# Patient Record
Sex: Female | Born: 1985 | Race: White | Hispanic: No | Marital: Married | Smoking: Never smoker
Health system: Southern US, Community
[De-identification: ages and names within clinical notes are randomized; demographics above are authoritative.]

## PROBLEM LIST (undated history)

## (undated) ENCOUNTER — Inpatient Hospital Stay (HOSPITAL_COMMUNITY): Payer: Self-pay

## (undated) DIAGNOSIS — K219 Gastro-esophageal reflux disease without esophagitis: Secondary | ICD-10-CM

## (undated) DIAGNOSIS — J45909 Unspecified asthma, uncomplicated: Secondary | ICD-10-CM

## (undated) DIAGNOSIS — M069 Rheumatoid arthritis, unspecified: Secondary | ICD-10-CM

## (undated) HISTORY — PX: APPENDECTOMY: SHX54

## (undated) HISTORY — DX: Unspecified asthma, uncomplicated: J45.909

## (undated) HISTORY — DX: Gastro-esophageal reflux disease without esophagitis: K21.9

---

## 2009-05-19 ENCOUNTER — Ambulatory Visit (HOSPITAL_COMMUNITY): Admission: RE | Admit: 2009-05-19 | Discharge: 2009-05-19 | Payer: Self-pay | Admitting: Urology

## 2009-10-31 ENCOUNTER — Emergency Department (HOSPITAL_COMMUNITY): Admission: EM | Admit: 2009-10-31 | Discharge: 2009-10-31 | Payer: Self-pay | Admitting: Emergency Medicine

## 2010-12-25 LAB — POCT RAPID STREP A (OFFICE): Streptococcus, Group A Screen (Direct): NEGATIVE

## 2013-09-21 ENCOUNTER — Other Ambulatory Visit: Payer: Self-pay | Admitting: Family Medicine

## 2013-09-21 ENCOUNTER — Ambulatory Visit
Admission: RE | Admit: 2013-09-21 | Discharge: 2013-09-21 | Disposition: A | Discharge status: Home / Self Care | Payer: PRIVATE HEALTH INSURANCE | Source: Ambulatory Visit | Attending: Family Medicine | Admitting: Family Medicine

## 2013-09-21 DIAGNOSIS — R609 Edema, unspecified: Secondary | ICD-10-CM

## 2013-09-21 DIAGNOSIS — M2550 Pain in unspecified joint: Secondary | ICD-10-CM

## 2014-08-30 DIAGNOSIS — M059 Rheumatoid arthritis with rheumatoid factor, unspecified: Secondary | ICD-10-CM | POA: Diagnosis present

## 2014-08-30 DIAGNOSIS — Z8669 Personal history of other diseases of the nervous system and sense organs: Secondary | ICD-10-CM | POA: Insufficient documentation

## 2021-10-08 NOTE — L&D Delivery Note (Signed)
Delivery Note  Angela Atkinson is a [id number] at [redacted]w[redacted]d with an LMP of 02/26/22, consistent with bedside US at [redacted]w[redacted]d ordered today stat by CNM. Patient had no prenatal care with this pregnancy, or prior pregnancies. No obstetrical history available.   First Stage: Labor onset: 1638 Analgesia /Anesthesia intrapartum: none SROM at 1915 GBS: unknown IP Antibiotics: N/A pt refused  Second Stage: Complete dilation at 1915 Onset of pushing at 1915 FHR second stage category 2 bpm with moderate, and intermittent variable decels  Angela Atkinson presented to L&D with vaginal bleeding. She was 5/70/-2. She progressed  to C/C with a strong urge to push.  She pushed x1 for a spontaneous vaginal birth.  Delivery of a viable baby boy on 02/07/2022 at 1916 precipitously in bed, with FOB, Doula and friend at bedside. Baby placed on mom's chest by patient and attended to by baby RN. Yashira requested that the baby's cord remained attached to baby and placenta   Cord blood sample collection: No patient refused O POS Performed at [facility], [address] [place], [place] [address]  Collection of cord blood donation N/A Arterial cord blood sample N/A  Third Stage: Patient refused Oxytocin after delivery of infant for hemorrhage prophylaxis  Placenta delivered by CNM Duncan's intact @ 1930 Placenta disposition: home with patient Uterine tone firm / bleeding moderate Patient refused uterine exploration   laceration identified  Anesthesia for repair: N/A Repair none Est. Blood Loss (mL): 540  Complications: none  Mom desires to d/c home.  Baby to Couplet care / Skin to Skin.  Newborn: Information for the patient's newborn:  Areanna, Gengler [[id number]]  Live born female  Birth Weight:   APGAR: 7, 9  Newborn Delivery   Birth date/time: 02/07/2022 19:16:00 Delivery type: Vaginal, Spontaneous        Feeding planned: Breast  ---------- Chari Manning, CNM Certified Nurse  Midwife [place]  [facility]

## 2022-02-07 ENCOUNTER — Observation Stay: Payer: BC Managed Care – PPO

## 2022-02-07 ENCOUNTER — Inpatient Hospital Stay
Admission: EM | Admit: 2022-02-07 | Discharge: 2022-02-07 | DRG: 807 | Disposition: A | Discharge status: Home / Self Care | Payer: BC Managed Care – PPO | Attending: Obstetrics | Admitting: Obstetrics

## 2022-02-07 ENCOUNTER — Other Ambulatory Visit: Payer: Self-pay

## 2022-02-07 ENCOUNTER — Encounter: Payer: Self-pay | Admitting: Obstetrics and Gynecology

## 2022-02-07 DIAGNOSIS — O4693 Antepartum hemorrhage, unspecified, third trimester: Secondary | ICD-10-CM | POA: Diagnosis present

## 2022-02-07 DIAGNOSIS — Z3A37 37 weeks gestation of pregnancy: Secondary | ICD-10-CM

## 2022-02-07 DIAGNOSIS — O26893 Other specified pregnancy related conditions, third trimester: Principal | ICD-10-CM | POA: Diagnosis present

## 2022-02-07 HISTORY — DX: Rheumatoid arthritis, unspecified: M06.9

## 2022-02-07 LAB — CBC
HCT: 37.5 % (ref 36.0–46.0)
Hemoglobin: 12 g/dL (ref 12.0–15.0)
MCH: 26.4 pg (ref 26.0–34.0)
MCHC: 32 g/dL (ref 30.0–36.0)
MCV: 82.4 fL (ref 80.0–100.0)
Platelets: 275 10*3/uL (ref 150–400)
RBC: 4.55 MIL/uL (ref 3.87–5.11)
RDW: 12.2 % (ref 11.5–15.5)
WBC: 18.3 10*3/uL — ABNORMAL HIGH (ref 4.0–10.5)
nRBC: 0 % (ref 0.0–0.2)

## 2022-02-07 LAB — TYPE AND SCREEN
ABO/RH(D): O POS
Antibody Screen: NEGATIVE

## 2022-02-07 LAB — RAPID HIV SCREEN (HIV 1/2 AB+AG)
HIV 1/2 Antibodies: NONREACTIVE
HIV-1 P24 Antigen - HIV24: NONREACTIVE

## 2022-02-07 LAB — ABO/RH: ABO/RH(D): O POS

## 2022-02-07 LAB — HEPATITIS B SURFACE ANTIGEN: Hepatitis B Surface Ag: NONREACTIVE

## 2022-02-07 MED ORDER — ACETAMINOPHEN 325 MG PO TABS: 650.0000 mg | ORAL_TABLET | ORAL | Status: DC | PRN

## 2022-02-07 MED ORDER — CALCIUM CARBONATE ANTACID 500 MG PO CHEW: 2.0000 | CHEWABLE_TABLET | ORAL | Status: DC | PRN

## 2022-02-07 MED ORDER — OXYTOCIN BOLUS FROM INFUSION: 333.0000 mL | Freq: Once | INTRAVENOUS | Status: DC

## 2022-02-07 MED ORDER — BUTORPHANOL TARTRATE 1 MG/ML IJ SOLN: 1.0000 mg | INTRAMUSCULAR | Status: DC | PRN

## 2022-02-07 MED ORDER — MISOPROSTOL 200 MCG PO TABS
ORAL_TABLET | ORAL | Status: AC
  Filled 2022-02-07: qty 4

## 2022-02-07 MED ORDER — OXYTOCIN 10 UNIT/ML IJ SOLN
INTRAMUSCULAR | Status: AC
  Filled 2022-02-07: qty 2

## 2022-02-07 MED ORDER — OXYTOCIN-SODIUM CHLORIDE 30-0.9 UT/500ML-% IV SOLN
INTRAVENOUS | Status: AC
  Filled 2022-02-07: qty 500

## 2022-02-07 MED ORDER — OXYTOCIN-SODIUM CHLORIDE 30-0.9 UT/500ML-% IV SOLN: 2.5000 [IU]/h | INTRAVENOUS | Status: DC

## 2022-02-07 MED ORDER — LIDOCAINE HCL (PF) 1 % IJ SOLN
INTRAMUSCULAR | Status: AC
  Filled 2022-02-07: qty 30

## 2022-02-07 MED ORDER — IBUPROFEN 600 MG PO TABS: 600.0000 mg | ORAL_TABLET | Freq: Four times a day (QID) | ORAL | 0 refills | Status: DC | PRN

## 2022-02-07 MED ORDER — DOCUSATE SODIUM 100 MG PO CAPS: 100.0000 mg | ORAL_CAPSULE | Freq: Every day | ORAL | 0 refills | Status: DC

## 2022-02-07 MED ORDER — AMMONIA AROMATIC IN INHA
RESPIRATORY_TRACT | Status: AC
  Filled 2022-02-07: qty 10

## 2022-02-07 MED ORDER — ZOLPIDEM TARTRATE 5 MG PO TABS: 5.0000 mg | ORAL_TABLET | Freq: Every evening | ORAL | Status: DC | PRN

## 2022-02-07 MED ORDER — PRENATAL MULTIVITAMIN CH: 1.0000 | ORAL_TABLET | Freq: Every day | ORAL | Status: DC

## 2022-02-07 MED ORDER — CALCIUM CARBONATE ANTACID 500 MG PO CHEW: 2.0000 | CHEWABLE_TABLET | ORAL | Status: AC | PRN

## 2022-02-07 MED ORDER — SOD CITRATE-CITRIC ACID 500-334 MG/5ML PO SOLN: 30.0000 mL | ORAL | Status: DC | PRN

## 2022-02-07 MED ORDER — LACTATED RINGERS IV SOLN: 500.0000 mL | INTRAVENOUS | Status: DC | PRN

## 2022-02-07 MED ORDER — ONDANSETRON HCL 4 MG/2ML IJ SOLN: 4.0000 mg | Freq: Four times a day (QID) | INTRAMUSCULAR | Status: DC | PRN

## 2022-02-07 MED ORDER — LIDOCAINE HCL (PF) 1 % IJ SOLN: 30.0000 mL | INTRAMUSCULAR | Status: DC | PRN

## 2022-02-07 MED ORDER — DOCUSATE SODIUM 100 MG PO CAPS: 100.0000 mg | ORAL_CAPSULE | Freq: Every day | ORAL | Status: DC

## 2022-02-07 MED ORDER — LACTATED RINGERS IV SOLN: INTRAVENOUS | Status: DC

## 2022-02-07 NOTE — H&P (Signed)
OB History & Physical   History of Present Illness:   Chief Complaint:37.[redacted] weeks gestation with vaginal bleeding and contractions  HPI:  Shereece Wellborn is a 36 y.o. [phone] female at [redacted]w[redacted]d dated by LMP per patient.  She presents to L&D for vaginal bleeding and active labor  Reports active fetal movement  Contractions: every 1 to 2 minutes LOF/SROM: intact Vaginal bleeding: moderate amount of  dark red blood  Factors complicating pregnancy:  No prenatal care  There are no problems to display for this patient.    Maternal Medical History:   Past Medical History:  Diagnosis Date   Rheumatoid arthritis (HCC)     History reviewed. No pertinent surgical history.  No Known Allergies  Prior to Admission medications   Medication Sig Start Date End Date Taking? Authorizing Provider  etanercept (ENBREL) 50 MG/ML injection Inject 50 mg into the skin once a week.   Yes [provider]  Prenatal Vit-Fe Fumarate-FA (MULTIVITAMIN-PRENATAL) 27-0.8 MG TABS tablet Take 1 tablet by mouth daily at 12 noon.   Yes [provider]     Prenatal care site:  none  Social History: She  reports that she has never smoked. She has never used smokeless tobacco.  Family History: family history is not on file.   Review of Systems: A full review of systems was performed and negative except as noted in the HPI.     Physical Exam:  Vital Signs: BP 133/87 (BP Location: Left Arm)   Pulse (!) 102   Temp 98.3 F (36.8 C) (Oral)   Resp 18   Ht 5' 9.5" (1.765 m)   Wt 68.5 kg   Breastfeeding Unknown   BMI 21.98 kg/m  Physical Exam  General: no acute distress.  HEENT: normocephalic, atraumatic Heart: regular rate & rhythm.  No murmurs/rubs/gallops Lungs: clear to auscultation bilaterally, normal respiratory effort Abdomen: soft, gravid, non-tender;  EFW: 3,048g Pelvic:   External: Normal external female genitalia  Cervix: Dilation: 5 / Effacement (%): 70 /       Extremities: non-tender, symmetric, no edema bilaterally.  DTRs: +2  Neurologic: Alert & oriented x 3.    No results found for this or any previous visit (from the past 24 hour(s)).  Pertinent Results:  Prenatal Labs: Blood type/Rh O pos  Antibody screen neg  Rubella pending  Varicella pending  RPR pending  HBsAg pending  HIV NR  GC refused  Chlamydia refused  Genetic screening Not done  1 hour GTT none  3 hour GTT none  GBS refused   FHT:  FHR: 145 bpm, variability: moderate,  accelerations:  Present,  decelerations:  Absent Category/reactivity:  Category I UC:   regular, every 1-2 minutes   Cephalic by [place] and SVE   No results found.  Assessment:  Jaelin Devincentis is a 36 y.o. [phone] female at [redacted]w[redacted]d with vaginal bleeding and uterine contractions in third trimester. Monchel reports that this is her fourth baby and she has not had any prenatal care with any of therm and has had all uncomplicated births at home. She reports she is dated by her LMP and dating was verified by a 20 week [place].  She states the only reason she came to the hospital is because she noticed vaginal bleeding around 1330 today [place] with her [place]. She then states she then took a shower to see if it would help her feel better. Once the bleeding continued she decided to come in  to the hospital for evaluation.   Plan:  1. Admit to Labor & Delivery - consents reviewed and obtained  2. Fetal Well being  - Fetal Tracing: category 1 - Group B Streptococcus ppx unknown: GBS unknown - Presentation: vertex confirmed by [place]   3. Patient refused GBS, and IV  4. Monitoring of labor  - Contractions monitored with external toco - Pelvis proven to 8.13 lbs adequate for trial of labor  - Plan for expectant management  -  expectant management - Plan for  continuous fetal monitoring - Maternal pain control as desired; planning unmedicated labor support options  - Anticipate vaginal  delivery  5. Post Partum Planning:Patient plans to leave a couple hours after delivery. - Infant feeding: breast - Contraception: TBD - Tdap vaccine: declined - Flu vaccine: declined  Discussed plan of care with Dr Feliberto Gottron,   Chari Manning CNM Certified Nurse Midwife [place]  [facility]

## 2022-02-07 NOTE — Progress Notes (Signed)
Patient declined recommendations of standard care and testing as well as any interventions such as an IV, uterotonic's, and lab work such as GBS, Scientist, physiological. Patient has had no prenatal care for the entirety of the pregnancy.  Stat bedside US verified gestational age, normal fluid, and presentation. [place] verified that no placental abruption or previa was present.  Patient states that she wants to delivery her own baby, wants baby born and delayed cord clamping until the delivery of the placenta. I expressed concerns about her vaginal bleeding and wanting to administer Pitocin to prevent a PP hemorrhage. Patient continued to declined. Patient then counseled on risks of refusing labs, IV, and medications including, infection, hemorrhage, and loss of life for both her and baby.   Patient reports desire of  leaving a couple hours after delivery. Neonatology, and SS aware. Dr Ouida Sills notified and aware of discussion and patient wishes.  Avelino Leeds CNM

## 2022-02-07 NOTE — Progress Notes (Signed)
Pt to be d/c home and to self care. Pt given teaching on when to seek medical attention (LOF, VB and decreased FM, etc..). Pt verbalized understanding of d/c instructions.   ?

## 2022-02-07 NOTE — Discharge Summary (Signed)
Obstetrical Discharge Summary  Patient Name: Angela Atkinson DOB: 1985/10/18 MRN: [id number]  Date of Admission: 02/07/2022 Date of Delivery: 02/07/22 Delivered by: Angela Atkinson Date of Discharge: 02/07/2022  Primary OB: No PNC  LMP:No LMP recorded. Patient is pregnant. EDC Estimated Date of Delivery: 02/26/22 Gestational Age at Delivery: [redacted]w[redacted]d   Antepartum complications:  No PNC, planned homebirth  Admitting Diagnosis: 37wks, vaginal bleeding Secondary Diagnosis: SVD  Patient Active Problem List   Diagnosis Date Noted   Normal labor and delivery 02/07/2022    Augmentation: N/A Complications: None Intrapartum complications/course: Pt delivered with minimal assistance, has chosen to have Lotus birth. Stable PP bleeding and intact perineum.  Date of Delivery: 02/07/22 Delivered By: Angela Atkinson CNM Delivery Type: spontaneous vaginal delivery Anesthesia: none Placenta: spontaneous Laceration: none Episiotomy: none Newborn Data: Live born female "Angela Atkinson" Birth Weight:  pending- pt opted for infant to be weighed at home.  APGAR: 7, 9  Newborn Delivery   Birth date/time: 02/07/2022 19:16:00 Delivery type: Vaginal, Spontaneous      Postpartum Procedures: none  Edinburgh: Pt declined EPDS screening.       View : No data to display.            Post partum course:  Patient had an uncomplicated postpartum course.  By time of discharge on PPD#0 at approx 3hours postpartum, her pain was controlled on oral pain medications; she had appropriate lochia and was ambulating, voiding without difficulty and tolerating regular diet.  She was deemed stable for discharge to home.     Discharge Physical Exam: 02/07/22 at 2120  BP 133/87 (BP Location: Left Arm)   Pulse (!) 102   Temp 98.3 F (36.8 C) (Oral)   Resp 18   Ht 5' 9.5" (1.765 m)   Wt 68.5 kg   Breastfeeding Unknown   BMI 21.98 kg/m   General: NAD CV: RRR Pulm: CTABL, nl effort ABD: s/nd/nt, fundus firm and below the  umbilicus Lochia: scant Perineum: intact DVT Evaluation: LE non-ttp, no evidence of DVT on exam.  Hemoglobin  Date Value Ref Range Status  02/07/2022 12.0 12.0 - 15.0 g/dL Final   HCT  Date Value Ref Range Status  02/07/2022 37.5 36.0 - 46.0 % Final     Disposition: stable, discharge to home. Baby Feeding: breastmilk Baby Disposition: home with mom  Rh Immune globulin given: n/a Rubella vaccine given: pending Varicella vaccine given: pending Tdap vaccine given in AP or PP setting: declined PP Flu vaccine given in AP or PP setting: declined PP  Contraception: Lactational amenorrhea  Prenatal Labs:  Blood type/Rh O pos  Antibody screen neg  Rubella pending  Varicella pending  RPR pending  HBsAg pending  HIV NR  GC refused  Chlamydia refused  Genetic screening Not done  1 hour GTT none  3 hour GTT none  GBS refused      Plan:  Angela Atkinson was discharged to home in good condition. Follow-up appointment with delivering provider in 6 weeks.  Discharge Medications: Allergies as of 02/07/2022   No Known Allergies      Medication List     TAKE these medications    acetaminophen 325 MG tablet Commonly known as: TYLENOL Take 2 tablets (650 mg total) by mouth every 4 (four) hours as needed (for pain scale < 4  OR  temperature  >/=  100.5 F).   calcium carbonate 500 MG chewable tablet Commonly known as: TUMS - dosed in mg elemental calcium Chew 2 tablets (400 mg of  elemental calcium total) by mouth every 4 (four) hours as needed for indigestion.   docusate sodium 100 MG capsule Commonly known as: COLACE Take 1 capsule (100 mg total) by mouth daily.   Enbrel 50 MG/ML injection Generic drug: etanercept Inject 50 mg into the skin once a week.   ibuprofen 600 MG tablet Commonly known as: ADVIL Take 1 tablet (600 mg total) by mouth every 6 (six) hours as needed.   multivitamin-prenatal 27-0.8 MG Tabs tablet Take 1 tablet by mouth daily at 12 noon.           Signed:  Randa Atkinson, CNM 02/07/2022  9:35 PM

## 2022-02-07 NOTE — OB Triage Note (Signed)
Pt presents from home with ctx every 1-2 minutes, vaginal bleeding and decreased FM. Pt reports no prenatal care but had [place] at 20w. Hx of 3 home births. Bleeding started around 1pm. VSS.

## 2022-02-08 LAB — RUBELLA SCREEN: Rubella: 1.62 {index}

## 2022-02-08 LAB — VARICELLA ZOSTER ANTIBODY, IGG: Varicella IgG: 897 {index}

## 2022-02-08 NOTE — TOC Initial Note (Signed)
Transition of Care [facility]) - Initial/Assessment Note    Patient Details  Name: Angela Atkinson MRN: [id number] Date of Birth: [birth year]  Transition of [facility]) CM/SW Contact:    Orchard Cellar, RN Phone Number: 02/08/2022, 12:53 PM  Clinical Narrative:                 Confirmed with [place] CPS case was screened out as no needs identified. 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 Has 3 other children and a supportive husband at home. Prefers more natural lifestyle with limited medical interventions.         Patient Goals and CMS Choice        Expected Discharge Plan and Services           Expected Discharge Date: 02/07/22                                    Prior Living Arrangements/Services                       Activities of Daily Living Home Assistive Devices/Equipment: None ADL Screening (condition at time of admission) Patient's cognitive ability adequate to safely complete daily activities?: No Is the patient deaf or have difficulty hearing?: No Does the patient have difficulty seeing, even when wearing glasses/contacts?: No Does the patient have difficulty concentrating, remembering, or making decisions?: No Patient able to express need for assistance with ADLs?: Yes Does the patient have difficulty dressing or bathing?: No Independently performs ADLs?: Yes (appropriate for developmental age) Does the patient have difficulty walking or climbing stairs?: No Weakness of Legs: None Weakness of Arms/Hands: None  Permission Sought/Granted                  Emotional Assessment              Admission diagnosis:  Vaginal bleeding during pregnancy, antepartum [O46.90] Normal labor and delivery [O80] Patient Active Problem List   Diagnosis Date Noted   Normal labor and delivery 02/07/2022   PCP:  No primary care provider on file. Pharmacy:   [id number] [place], [place] - [address] [place] [address] Phone: [phone] Fax: [phone]     Social Determinants of Health (SDOH) Interventions    Readmission Risk Interventions     View : No data to display.

## 2022-02-10 LAB — RPR: RPR Ser Ql: NONREACTIVE

## 2022-02-28 ENCOUNTER — Emergency Department: Payer: BC Managed Care – PPO

## 2022-02-28 ENCOUNTER — Observation Stay
Admission: EM | Admit: 2022-02-28 | Discharge: 2022-03-01 | Disposition: A | Discharge status: Home / Self Care | Payer: BC Managed Care – PPO | Attending: Surgery | Admitting: Surgery

## 2022-02-28 ENCOUNTER — Encounter: Payer: Self-pay | Admitting: Emergency Medicine

## 2022-02-28 ENCOUNTER — Observation Stay: Payer: BC Managed Care – PPO | Admitting: Anesthesiology

## 2022-02-28 ENCOUNTER — Other Ambulatory Visit: Payer: Self-pay

## 2022-02-28 ENCOUNTER — Encounter
Admission: EM | Disposition: A | Discharge status: Home / Self Care | Payer: Self-pay | Source: Home / Self Care | Attending: Emergency Medicine

## 2022-02-28 DIAGNOSIS — K3580 Unspecified acute appendicitis: Principal | ICD-10-CM | POA: Diagnosis present

## 2022-02-28 DIAGNOSIS — R1030 Lower abdominal pain, unspecified: Secondary | ICD-10-CM

## 2022-02-28 DIAGNOSIS — D72829 Elevated white blood cell count, unspecified: Secondary | ICD-10-CM | POA: Diagnosis not present

## 2022-02-28 DIAGNOSIS — K353 Acute appendicitis with localized peritonitis, without perforation or gangrene: Secondary | ICD-10-CM

## 2022-02-28 HISTORY — PX: LAPAROSCOPIC APPENDECTOMY: SHX408

## 2022-02-28 LAB — COMPREHENSIVE METABOLIC PANEL
ALT: 24 U/L (ref 0–44)
AST: 23 U/L (ref 15–41)
Albumin: 3.9 g/dL (ref 3.5–5.0)
Alkaline Phosphatase: 73 U/L (ref 38–126)
Anion gap: 10 (ref 5–15)
BUN: 19 mg/dL (ref 6–20)
CO2: 23 mmol/L (ref 22–32)
Calcium: 9.6 mg/dL (ref 8.9–10.3)
Chloride: 105 mmol/L (ref 98–111)
Creatinine, Ser: 0.75 mg/dL (ref 0.44–1.00)
GFR, Estimated: >60 mL/min (ref >60)
Glucose, Bld: 118 mg/dL — ABNORMAL HIGH (ref 70–99)
Potassium: 3.9 mmol/L (ref 3.5–5.1)
Sodium: 138 mmol/L (ref 135–145)
Total Bilirubin: 1 mg/dL (ref 0.3–1.2)
Total Protein: 7.7 g/dL (ref 6.5–8.1)

## 2022-02-28 LAB — URINALYSIS, ROUTINE W REFLEX MICROSCOPIC
Bacteria, UA: NONE SEEN
Bilirubin Urine: NEGATIVE
Glucose, UA: NEGATIVE mg/dL
Ketones, ur: 20 mg/dL — AB
Nitrite: NEGATIVE
Protein, ur: NEGATIVE mg/dL
RBC / HPF: >50 RBC/hpf — ABNORMAL HIGH (ref 0–5)
Specific Gravity, Urine: 1.021 (ref 1.005–1.030)
pH: 5 (ref 5.0–8.0)

## 2022-02-28 LAB — CBC
HCT: 37.3 % (ref 36.0–46.0)
Hemoglobin: 11.5 g/dL — ABNORMAL LOW (ref 12.0–15.0)
MCH: 24.4 pg — ABNORMAL LOW (ref 26.0–34.0)
MCHC: 30.8 g/dL (ref 30.0–36.0)
MCV: 79 fL — ABNORMAL LOW (ref 80.0–100.0)
Platelets: 352 10*3/uL (ref 150–400)
RBC: 4.72 MIL/uL (ref 3.87–5.11)
RDW: 12.9 % (ref 11.5–15.5)
WBC: 12.5 10*3/uL — ABNORMAL HIGH (ref 4.0–10.5)
nRBC: 0 % (ref 0.0–0.2)

## 2022-02-28 LAB — POC URINE PREG, ED: Preg Test, Ur: NEGATIVE

## 2022-02-28 LAB — LIPASE, BLOOD: Lipase: 44 U/L (ref 11–51)

## 2022-02-28 SURGERY — APPENDECTOMY, LAPAROSCOPIC
Anesthesia: General | Site: Abdomen

## 2022-02-28 MED ORDER — FENTANYL CITRATE (PF) 100 MCG/2ML IJ SOLN
INTRAMUSCULAR | Status: AC
Start: 2022-02-28 — End: ?
  Filled 2022-02-28: qty 2

## 2022-02-28 MED ORDER — ROCURONIUM BROMIDE 100 MG/10ML IV SOLN
INTRAVENOUS | Status: DC | PRN
  Administered 2022-02-28: 50 mg via INTRAVENOUS

## 2022-02-28 MED ORDER — DROPERIDOL 2.5 MG/ML IJ SOLN: 0.6250 mg | Freq: Once | INTRAMUSCULAR | Status: DC | PRN

## 2022-02-28 MED ORDER — 0.9 % SODIUM CHLORIDE (POUR BTL) OPTIME
TOPICAL | Status: DC | PRN
  Administered 2022-02-28: 5 mL

## 2022-02-28 MED ORDER — KETOROLAC TROMETHAMINE 30 MG/ML IJ SOLN
INTRAMUSCULAR | Status: DC | PRN
  Administered 2022-02-28: 30 mg via INTRAVENOUS

## 2022-02-28 MED ORDER — ONDANSETRON 4 MG PO TBDP: 4.0000 mg | ORAL_TABLET | Freq: Four times a day (QID) | ORAL | Status: DC | PRN

## 2022-02-28 MED ORDER — LIDOCAINE HCL (CARDIAC) PF 100 MG/5ML IV SOSY
PREFILLED_SYRINGE | INTRAVENOUS | Status: DC | PRN
  Administered 2022-02-28: 100 mg via INTRAVENOUS

## 2022-02-28 MED ORDER — BUPIVACAINE-EPINEPHRINE (PF) 0.25% -1:200000 IJ SOLN
INTRAMUSCULAR | Status: DC | PRN
  Administered 2022-02-28: 42 mL via INTRAMUSCULAR

## 2022-02-28 MED ORDER — LACTATED RINGERS IV SOLN: INTRAVENOUS | Status: DC | PRN

## 2022-02-28 MED ORDER — OXYCODONE HCL 5 MG/5ML PO SOLN: 5.0000 mg | Freq: Once | ORAL | Status: DC | PRN

## 2022-02-28 MED ORDER — FENTANYL CITRATE (PF) 100 MCG/2ML IJ SOLN: 25.0000 ug | INTRAMUSCULAR | Status: DC | PRN

## 2022-02-28 MED ORDER — ACETAMINOPHEN 10 MG/ML IV SOLN
INTRAVENOUS | Status: DC | PRN
  Administered 2022-02-28: 1000 mg via INTRAVENOUS

## 2022-02-28 MED ORDER — ACETAMINOPHEN 10 MG/ML IV SOLN
INTRAVENOUS | Status: AC
  Filled 2022-02-28: qty 100

## 2022-02-28 MED ORDER — ACETAMINOPHEN 325 MG PO TABS
650.0000 mg | ORAL_TABLET | Freq: Four times a day (QID) | ORAL | Status: DC | PRN
  Administered 2022-03-01: 650 mg via ORAL
  Filled 2022-02-28: qty 2

## 2022-02-28 MED ORDER — SUGAMMADEX SODIUM 200 MG/2ML IV SOLN
INTRAVENOUS | Status: DC | PRN
  Administered 2022-02-28: 250 mg via INTRAVENOUS

## 2022-02-28 MED ORDER — OXYCODONE HCL 5 MG PO TABS: 5.0000 mg | ORAL_TABLET | Freq: Once | ORAL | Status: DC | PRN

## 2022-02-28 MED ORDER — PIPERACILLIN-TAZOBACTAM 3.375 G IVPB 30 MIN
3.3750 g | Freq: Once | INTRAVENOUS | Status: AC
  Administered 2022-02-28: 3.375 g via INTRAVENOUS
  Filled 2022-02-28: qty 50

## 2022-02-28 MED ORDER — PROMETHAZINE HCL 25 MG/ML IJ SOLN: 6.2500 mg | INTRAMUSCULAR | Status: DC | PRN

## 2022-02-28 MED ORDER — MIDAZOLAM HCL 2 MG/2ML IJ SOLN
INTRAMUSCULAR | Status: AC
Start: 2022-02-28 — End: ?
  Filled 2022-02-28: qty 2

## 2022-02-28 MED ORDER — ACETAMINOPHEN 650 MG RE SUPP: 650.0000 mg | Freq: Four times a day (QID) | RECTAL | Status: DC | PRN

## 2022-02-28 MED ORDER — PIPERACILLIN-TAZOBACTAM 3.375 G IVPB
3.3750 g | Freq: Three times a day (TID) | INTRAVENOUS | Status: DC
  Administered 2022-02-28 – 2022-03-01 (×2): 3.375 g via INTRAVENOUS
  Filled 2022-02-28 (×3): qty 50

## 2022-02-28 MED ORDER — BUPIVACAINE LIPOSOME 1.3 % IJ SUSP
INTRAMUSCULAR | Status: AC
  Filled 2022-02-28: qty 20

## 2022-02-28 MED ORDER — ONDANSETRON HCL 4 MG/2ML IJ SOLN
INTRAMUSCULAR | Status: DC | PRN
  Administered 2022-02-28: 4 mg via INTRAVENOUS

## 2022-02-28 MED ORDER — IBUPROFEN 600 MG PO TABS
600.0000 mg | ORAL_TABLET | Freq: Four times a day (QID) | ORAL | Status: DC | PRN
  Administered 2022-03-01 (×2): 600 mg via ORAL
  Filled 2022-02-28 (×2): qty 1

## 2022-02-28 MED ORDER — ACETAMINOPHEN 10 MG/ML IV SOLN: 1000.0000 mg | Freq: Once | INTRAVENOUS | Status: DC | PRN

## 2022-02-28 MED ORDER — IOHEXOL 300 MG/ML  SOLN
100.0000 mL | Freq: Once | INTRAMUSCULAR | Status: AC | PRN
  Administered 2022-02-28: 100 mL via INTRAVENOUS

## 2022-02-28 MED ORDER — BUPIVACAINE-EPINEPHRINE (PF) 0.25% -1:200000 IJ SOLN
INTRAMUSCULAR | Status: AC
  Filled 2022-02-28: qty 30

## 2022-02-28 MED ORDER — PROPOFOL 10 MG/ML IV BOLUS
INTRAVENOUS | Status: DC | PRN
  Administered 2022-02-28: 160 mg via INTRAVENOUS

## 2022-02-28 MED ORDER — FENTANYL CITRATE (PF) 100 MCG/2ML IJ SOLN
INTRAMUSCULAR | Status: DC | PRN
Start: 2022-02-28 — End: 2022-02-28
  Administered 2022-02-28 (×2): 50 ug via INTRAVENOUS

## 2022-02-28 MED ORDER — ONDANSETRON HCL 4 MG/2ML IJ SOLN: 4.0000 mg | Freq: Four times a day (QID) | INTRAMUSCULAR | Status: DC | PRN

## 2022-02-28 MED ORDER — SODIUM CHLORIDE 0.9 % IR SOLN
Status: DC | PRN
  Administered 2022-02-28: 1500 mL

## 2022-02-28 MED ORDER — SODIUM CHLORIDE 0.9 % IV SOLN: INTRAVENOUS | Status: DC

## 2022-02-28 SURGICAL SUPPLY — 44 items
BAG RETRIEVAL 10 (BASKET) ×1
BLADE CLIPPER SURG (BLADE) ×2 IMPLANT
CUTTER FLEX LINEAR 45M (STAPLE) ×2 IMPLANT
DERMABOND ADVANCED (GAUZE/BANDAGES/DRESSINGS) ×1
DERMABOND ADVANCED .7 DNX12 (GAUZE/BANDAGES/DRESSINGS) ×1 IMPLANT
ELECT CAUTERY BLADE 6.4 (BLADE) ×1 IMPLANT
ELECT REM PT RETURN 9FT ADLT (ELECTROSURGICAL) ×2
ELECTRODE REM PT RTRN 9FT ADLT (ELECTROSURGICAL) ×1 IMPLANT
GLOVE BIO SURGEON STRL SZ7 (GLOVE) ×2 IMPLANT
GLOVE INDICATOR 7.0 STRL GRN (GLOVE) ×2 IMPLANT
GLOVE ORTHO TXT STRL SZ7.5 (GLOVE) ×3 IMPLANT
GOWN STRL REUS W/ TWL LRG LVL3 (GOWN DISPOSABLE) ×1 IMPLANT
GOWN STRL REUS W/ TWL XL LVL3 (GOWN DISPOSABLE) ×1 IMPLANT
GOWN STRL REUS W/TWL LRG LVL3 (GOWN DISPOSABLE) ×2
GOWN STRL REUS W/TWL XL LVL3 (GOWN DISPOSABLE) ×4
GRASPER SUT TROCAR 14GX15 (MISCELLANEOUS) IMPLANT
IRRIGATION STRYKERFLOW (MISCELLANEOUS) IMPLANT
IRRIGATOR STRYKERFLOW (MISCELLANEOUS) ×2
IV NS IRRIG 3000ML ARTHROMATIC (IV SOLUTION) ×1 IMPLANT
KIT TURNOVER KIT A (KITS) ×2 IMPLANT
MANIFOLD NEPTUNE II (INSTRUMENTS) ×2 IMPLANT
NDL INSUFFLATION 14GA 120MM (NEEDLE) IMPLANT
NEEDLE HYPO 22GX1.5 SAFETY (NEEDLE) ×2 IMPLANT
NEEDLE INSUFFLATION 14GA 120MM (NEEDLE) IMPLANT
NS IRRIG 500ML POUR BTL (IV SOLUTION) ×2 IMPLANT
PACK LAP CHOLECYSTECTOMY (MISCELLANEOUS) ×2 IMPLANT
PENCIL ELECTRO HAND CTR (MISCELLANEOUS) ×1 IMPLANT
RELOAD 45 VASCULAR/THIN (ENDOMECHANICALS) ×2 IMPLANT
RELOAD STAPLE 45 2.5 WHT GRN (ENDOMECHANICALS) ×1 IMPLANT
SET TUBE SMOKE EVAC HIGH FLOW (TUBING) ×2 IMPLANT
SHEARS HARMONIC ACE PLUS 36CM (ENDOMECHANICALS) ×2 IMPLANT
SLEEVE ADV FIXATION 5X100MM (TROCAR) ×2 IMPLANT
SPIKE FLUID TRANSFER (MISCELLANEOUS) ×1 IMPLANT
SUT MNCRL 4-0 (SUTURE) ×2
SUT MNCRL 4-0 27XMFL (SUTURE) ×1
SUT VICRYL 0 AB UR-6 (SUTURE) IMPLANT
SUTURE MNCRL 4-0 27XMF (SUTURE) ×1 IMPLANT
SYS BAG RETRIEVAL 10MM (BASKET) ×1
SYS KII FIOS ACCESS ABD 5X100 (TROCAR) ×2
SYSTEM BAG RETRIEVAL 10MM (BASKET) ×1 IMPLANT
SYSTEM KII FIOS ACES ABD 5X100 (TROCAR) ×1 IMPLANT
TRAY FOLEY MTR SLVR 16FR STAT (SET/KITS/TRAYS/PACK) ×1 IMPLANT
TROCAR ADV FIXATION 12X100MM (TROCAR) ×2 IMPLANT
WATER STERILE IRR 500ML POUR (IV SOLUTION) ×2 IMPLANT

## 2022-02-28 NOTE — Op Note (Signed)
Laparascopic appendectomy   Angela Atkinson Date of operation:  02/28/2022  Indications: The patient presented with a history of  abdominal pain. Workup has revealed findings consistent with acute appendicitis.  Pre-operative Diagnosis: Acute appendicitis without mention of peritonitis  Post-operative Diagnosis: Same  Surgeon: Campbell Lerner, M.D., FACS  Anesthesia: General with endotracheal tube  Findings: Exudative appendicitis with adjacent adhesive process to epiploicae and retroperitoneum.  Cloudy yellow fluid in pelvis.  Estimated Blood Loss: Minimal         Specimens: appendix         Complications: None  Procedure Details  The patient was seen again in the preop area. The options of surgery versus observation were reviewed with the patient and/or family. The risks of bleeding, infection, recurrence of symptoms, negative laparoscopy, potential for an open procedure, bowel injury, abscess or infection, were all reviewed as well. The patient was taken to Operating Room, identified as Angela Atkinson and the procedure verified as laparoscopic appendectomy. A Time Out was held and the above information confirmed. The patient was placed in the supine position and general anesthesia was induced.  Antibiotic prophylaxis was administered pre-op and VTE prophylaxis was in place.   The abdomen was prepped and draped in a sterile fashion. Local infiltration with 0.25% Marcaine with epi mixed with Exparel is administered to all incisions.  An peri-umbilical incision was made.  A towel grasper is applied for countertraction, and a 5 mm optical trocar was passed into the peritoneal cavity under direct visualization.  Pneumoperitoneum obtained. One 12 mm port in the LLQ, and another 5 mm port were placed under direct visualization.  The appendix was identified.  The appendix was carefully dissected. The mesoappendix was divided with Harmonic scalpel. The base of the appendix was dissected out and  divided with a 45 mm white load Endo GIA.The appendix was placed in a Endo Catch bag and removed via the 12 mm LLQ port. The right lower quadrant and pelvis was then irrigated with  normal saline which was aspirated. Inspection  failed to identify any additional bleeding and there were no signs of bowel injury. Again the right lower quadrant was inspected there was no sign of bleeding or bowel injury. The LLQ fascia was closed with 0 Vicryl using the suture passer, pneumoperitoneum was released, all ports were removed, and the skin incisions were approximated with subcuticular 4-0 Monocryl. Dermabond was applied.  The patient tolerated the procedure well, there were no complications. The sponge lap and needle count were correct at the end of the procedure.  The patient was taken to the recovery room in stable condition to be admitted for overnight observation.  Campbell Lerner, M.D., [facility] 02/28/2022 - 7:54 PM

## 2022-02-28 NOTE — ED Triage Notes (Signed)
Pt presents via POV with complaints of lower abdominal pain for the last 12 hours with associated nausea. No meds taken PTA. Denies Emesis, CP, or urinary sx.

## 2022-02-28 NOTE — ED Provider Notes (Signed)
Patient received in signout from Dr. York Cerise pending follow-up ultrasound.  Ultrasound was equivocal but patient with right lower quadrant pain on exam therefore CT abdomen pelvis with IV contrast was ordered after patient was given time to make informed consent.  CT imaging does show evidence of acute appendicitis.  She does not have any allergies to any antibiotics.  Discussed the case in consultation with Dr. Claudine Mouton of general surgery.   Willy Eddy, MD 02/28/22 [phone]

## 2022-02-28 NOTE — Anesthesia Procedure Notes (Signed)
Procedure Name: Intubation Date/Time: 02/28/2022 6:59 PM Performed by: Nelda Marseille, CRNA Pre-anesthesia Checklist: Patient identified, Patient being monitored, Timeout performed, Emergency Drugs available and Suction available Patient Re-evaluated:Patient Re-evaluated prior to induction Oxygen Delivery Method: Circle system utilized Preoxygenation: Pre-oxygenation with 100% oxygen Induction Type: IV induction Ventilation: Mask ventilation without difficulty Laryngoscope Size: Mac, 3 and McGraph Grade View: Grade I Tube type: Oral Tube size: 7.0 mm Number of attempts: 1 Airway Equipment and Method: Stylet Placement Confirmation: ETT inserted through vocal cords under direct vision, positive ETCO2 and breath sounds checked- equal and bilateral Secured at: 21 cm Tube secured with: Tape Dental Injury: Teeth and Oropharynx as per pre-operative assessment

## 2022-02-28 NOTE — ED Provider Notes (Signed)
[facility] Provider Note    Event Date/Time   First MD Initiated Contact with Patient 02/28/22 0354     (approximate)   History   Abdominal Pain   HPI  Angela Atkinson is a 36 y.o. female G4, P4 who had a normal spontaneous vaginal delivery at [facility] about 3 weeks ago.  She presents for evaluation of acute onset pain about 15 hours ago in her lower middle abdomen that has gradually gotten worse and has become severe.  It is accompanied with nausea but no vomiting and no diarrhea.  She reports that she has done no prenatal care and home births with all 4 of her children, but for this last 1 she started having heavy bleeding so she needed to come to the emergency department because she is an ED nurse in [employer].  There was some concern she may have a placental abruption but she had an ultrasound prior to the delivery which was reassuring.  She ended up having a normal spontaneous vaginal delivery with no complications.  She had been doing fine and not having any significant vaginal bleeding, just a little bit of spotting when she wipes, until the onset of the severe pain yesterday afternoon.  She also notes that she had an elevated temperature of just over 100 degrees at home.  No respiratory symptoms.  No dysuria.     Physical Exam   Triage Vital Signs: ED Triage Vitals  Enc Vitals Group     BP 02/28/22 0349 132/78     Pulse Rate 02/28/22 0349 (!) 103     Resp 02/28/22 0349 18     Temp 02/28/22 0349 98.3 F (36.8 C)     Temp Source 02/28/22 0349 Oral     SpO2 02/28/22 0349 98 %     Weight 02/28/22 0348 61.2 kg (135 lb)     Height 02/28/22 0348 1.765 m (5' 9.5")     Head Circumference --      Peak Flow --      Pain Score 02/28/22 0348 9     Pain Loc --      Pain Edu? --      Excl. in GC? --     Most recent vital signs: Vitals:   02/28/22 0349 02/28/22 0507  BP: 132/78 110/80  Pulse: (!) 103 94  Resp: 18 16  Temp: 98.3 F (36.8 C)   SpO2:  98% 98%     General: Awake, appears uncomfortable. CV:  Good peripheral perfusion.  Resp:  Normal effort.  Speaking easily and comfortably, no accessory muscle usage. Abd:  Healthy body habitus, not obese.  Localized peritonitis in the suprapubic region.  There is some mild tenderness in other areas but she has rebound and guarding infraumbilical and suprapubic.   ED Results / Procedures / Treatments   Labs (all labs ordered are listed, but only abnormal results are displayed) Labs Reviewed  COMPREHENSIVE METABOLIC PANEL - Abnormal; Notable for the following components:      Result Value   Glucose, Bld 118 (*)    All other components within normal limits  CBC - Abnormal; Notable for the following components:   WBC 12.5 (*)    Hemoglobin 11.5 (*)    MCV 79.0 (*)    MCH 24.4 (*)    All other components within normal limits  URINALYSIS, ROUTINE W REFLEX MICROSCOPIC - Abnormal; Notable for the following components:   Color, Urine YELLOW (*)    APPearance  HAZY (*)    Hgb urine dipstick LARGE (*)    Ketones, ur 20 (*)    Leukocytes,Ua SMALL (*)    RBC / HPF >50 (*)    All other components within normal limits  LIPASE, BLOOD  POC URINE PREG, ED    RADIOLOGY Ultrasound pending at time of transfer of care    PROCEDURES:  Critical Care performed: No  Procedures   MEDICATIONS ORDERED IN ED: Medications - No data to display   IMPRESSION / MDM / ASSESSMENT AND PLAN / ED COURSE  I reviewed the triage vital signs and the nursing notes.                              Differential diagnosis includes, but is not limited to, retained products of conception, endometritis, hemoperitoneum, uterine rupture, ovarian cysts, renal/ureteral colic, UTI/pyelonephritis, STD/PID.  Patient's presentation is most consistent with acute presentation with potential threat to life or bodily function.  Patient's vital signs are notable for mild tachycardia at 103.  She appears acutely  uncomfortable.  However she reports that she is breast-feeding and she wants to have as " minimally invasive" and evaluation and treatment as she can.  She does not want to take any medications unless absolutely necessary and also wants to avoid a CT scan with IV contrast if there is any concern that the IV contrast would be a problem for her breast-feeding infant.  I reviewed her discharge summary from [facility] on 02/07/2022.  It appears that the patient declined essentially all treatments, medications, vaccinations, even weight measurement at the time of the delivery.  I personally reviewed and interpreted the pelvic ultrasound performed prior to the delivery of the baby and see evidence of a normal intrauterine pregnancy but did not see any abnormalities at that time.  However she had no follow-up imaging.  I talked with her about the various imaging modalities, explaining that I am very concerned about the possibility of retained products of conception and/or developing endometritis.  She does not want to proceed with any treatment until additional evaluation.  We are going to obtain a pelvic ultrasound as per ultrasound protocol to look for evidence of retained products and/or endometritis and pelvic free fluid.  The patient understands we may need to proceed with a CT scan with IV contrast.  Labs ordered include CMP, CBC, urinalysis, lipase, and urine pregnancy test.  The CMP, lipase, and CBC are all essentially normal except that she does have a mild leukocytosis of 12.5.  Urine pregnancy test is negative.  Urinalysis demonstrates hematuria but no clear evidence of infection.   Clinical Course as of 02/28/22 0714  Wed Feb 28, 2022  0709 Transferred ED care to Dr. Roxan Hockey to follow up on ultrasound and treat/dispo appropriately. [CF]    Clinical Course User Index [CF] Loleta Rose, MD     FINAL CLINICAL IMPRESSION(S) / ED DIAGNOSES   Final diagnoses:  Lower abdominal pain     Rx  / DC Orders   ED Discharge Orders     None        Note:  This document was prepared using Dragon voice recognition software and may include unintentional dictation errors.   Loleta Rose, MD 02/28/22 [phone]

## 2022-02-28 NOTE — Progress Notes (Signed)
Admission profile updated. ?

## 2022-02-28 NOTE — ED Notes (Signed)
Z. S., PA at bedside now.

## 2022-02-28 NOTE — ED Notes (Signed)
Lupita Leash, [place] technician at bedside to take patient to ultrasound department. Patient stated her mother and baby would need to come with her as she is breastfeeding and states her baby could wake up and need to eat. Patient informed that visitors are not permitted in [place] department. Patient states that she is not going without family. Lupita Leash reports she will bring machine to room.

## 2022-02-28 NOTE — Lactation Note (Signed)
Lactation Consultation Note  Patient Name: Angela Atkinson [id number] Date: 02/28/2022   Age:36 y.o.  Maternal Data   Patient is 3 weeks post partum.This is her 4th baby and she is exclusively breastfeeding. She is an experienced breastfeeding mother with a history of mastitis with her first baby, and experienced clogged ducts when breastfeeding and using a [place] pump that created over supply at subsequent breastfeeding experiences.   Today she is experiencing a small area on her right breast that she feels is beginning to clog. Per her report she has had some difficulty positioning baby on the right side due to her IV. She has questions today about what to expect with her milk supply related to her impending surgery. Per chart review patient came to ED with acute onset of abdominal pain. She is currently waiting to go to the OR for acute appendicitis. She reports she is hoping not to take narcotic pain medication for pain management if possible.  Feeding   Patient is exclusively breastfeeding. Baby is with her. The patient's husband will bring bottles from home to feed the baby when she is in surgery. Mom's plan is to continue breastfeeding as soon as she is able.     Interventions  Discussed management of clogged duct, strategies to proactively decrease potential decrease in milk supply that can happen when the body perceives a stressor, and provided information regarding compatibility of breastfeeding with management of her post op pain. Also, provided patient with information of receiving anesthesia while lactating. Patient verbalized understanding of information provided.  Consult Status  PRN  Update provided to care nurse.    [name] 02/28/2022, 2:11 PM

## 2022-02-28 NOTE — Discharge Instructions (Addendum)
From the [employer] of Anesthesia regarding breast feeding and anesthesia medications:  The following recommendations are suggested for lactating women requiring surgery:  1. All anesthetic and analgesic drugs transfer to breastmilk; however, only small amounts are present and in very low concentrations considered clinically insignificant.  2. Narcotics and/or their metabolites may transfer in slightly higher levels into breastmilk; therefore, steps should be taken to lower narcotic requirements by adding other analgesics when appropriate and avoiding drugs that are more likely to transfer (i.e., have a higher RID).  3. Because pain interferes with successful breastfeeding, women should not avoid pain medicines after surgery. Despite an excellent safety record, breastfeeding women who require narcotic pain medicines should always watch the baby closely for signs of sedation: difficult to wake and/or slowed breathing.  4. When possible, spinal or epidural anesthesia consisting of local anesthetic and a long-acting narcotic, should be used for cesarean delivery to reduce overall post-operative pain medication requirements.   5. Patients should resume breastfeeding as soon as possible after surgery because anesthetic drugs appear in such low levels in breastmilk. It is not recommended that patients "pump and dump."   In addition to included general post-operative instructions,  Diet: Resume home diet.   Activity: No heavy lifting >20 pounds (children, pets, laundry, garbage) for 4 weeks, but light activity and walking are encouraged. Do not drive or drink alcohol if taking narcotic pain medications or having pain that might distract from driving.  Wound care: 2 days after surgery (05/26), you may shower/get incision wet with soapy water and pat dry (do not rub incisions), but no baths or submerging incision underwater until follow-up.   Medications: Resume all home medications. For mild to  moderate pain: acetaminophen (Tylenol) or ibuprofen/naproxen (if no kidney disease). Combining Tylenol with alcohol can substantially increase your risk of causing liver disease. Narcotic pain medications, if prescribed, can be used for severe pain, though may cause nausea, constipation, and drowsiness. Do not combine Tylenol and Percocet (or similar) within a 6 hour period as Percocet (and similar) contain(s) Tylenol. If you do not need the narcotic pain medication, you do not need to fill the prescription.  Call office [phone] / [phone]) at any time if any questions, worsening pain, fevers/chills, bleeding, drainage from incision site, or other concerns.

## 2022-02-28 NOTE — Anesthesia Preprocedure Evaluation (Addendum)
Anesthesia Evaluation  Patient identified by MRN, date of birth, ID band Patient awake    Reviewed: Allergy & Precautions, NPO status , Patient's Chart, lab work & pertinent test results  Airway Mallampati: III  TM Distance: >3 FB Neck ROM: full    Dental no notable dental hx.    Pulmonary neg pulmonary ROS,    Pulmonary exam normal        Cardiovascular negative cardio ROS Normal cardiovascular exam     Neuro/Psych negative neurological ROS  negative psych ROS   GI/Hepatic Neg liver ROS, acute appendicitis   Endo/Other  negative endocrine ROS  Renal/GU      Musculoskeletal  (+) Arthritis , Rheumatoid disorders,    Abdominal   Peds  Hematology  (+) Blood dyscrasia, anemia ,   Anesthesia Other Findings 3 weeks post-partum   Past Medical History: No date: Rheumatoid arthritis (HCC)  BMI    Body Mass Index: 19.65 kg/m      Reproductive/Obstetrics negative OB ROS                            Anesthesia Physical Anesthesia Plan  ASA: 2  Anesthesia Plan: General ETT   Post-op Pain Management: Tylenol PO (pre-op)* and Toradol IV (intra-op)*   Induction: Intravenous  PONV Risk Score and Plan: Ondansetron, Dexamethasone, Midazolam and Treatment may vary due to age or medical condition  Airway Management Planned: Oral ETT  Additional Equipment:   Intra-op Plan:   Post-operative Plan: Extubation in OR  Informed Consent: I have reviewed the patients History and Physical, chart, labs and discussed the procedure including the risks, benefits and alternatives for the proposed anesthesia with the patient or authorized representative who has indicated his/her understanding and acceptance.     Dental Advisory Given  Plan Discussed with: Anesthesiologist, CRNA and Surgeon  Anesthesia Plan Comments:        Anesthesia Quick Evaluation

## 2022-02-28 NOTE — ED Notes (Signed)
Warm blankets provided to patient. Recliner brought to room for patient's mother. Denies additional needs at this time.

## 2022-02-28 NOTE — Transfer of Care (Signed)
Immediate Anesthesia Transfer of Care Note  Patient: Angela Atkinson  Procedure(s) Performed: APPENDECTOMY LAPAROSCOPIC (Abdomen)  Patient Location: PACU  Anesthesia Type:General  Level of Consciousness: oriented, drowsy and patient cooperative  Airway & Oxygen Therapy: Patient Spontanous Breathing  Post-op Assessment: Report given to RN and Post -op Vital signs reviewed and stable  Post vital signs: Reviewed and stable  Last Vitals:  Vitals Value Taken Time  BP    Temp    Pulse    Resp    SpO2      Last Pain:  Vitals:   02/28/22 1603  TempSrc: Oral  PainSc:          Complications: No notable events documented.

## 2022-02-28 NOTE — H&P (Signed)
[facility] SURGICAL HISTORY & PHYSICAL (cpt [phone])  HISTORY OF PRESENT ILLNESS (HPI):  36 y.o. female presented to [facility] ED this morning for abdominal pain. Patient reports the acute onset of diffuse lower abdominal pain yesterday afternoon. This has continued to progressively worsen since the onset. Worse in the suprapubic region. Pain has been accompanied by decreased appetite, nausea, and fever to 100.33F at home. No CP, SOB, urinary changes, or bowel changes. Of note, she is 3 weeks post-partum (G4 P4), notes reviewed. She is breast feeding. Work up in the ED did reveal leukocytosis to 12.5K, normal renal function with sCr - 0.75, and no electrolyte derangements. She did undergo CT Abdomen/Pelvis which was concerning for acute appendicitis.   General surgery is consulted by emergency medicine physician Dr Willy Eddy, MD for evaluation and management of acute appendicitis    PAST MEDICAL HISTORY (PMH):  Past Medical History:  Diagnosis Date   Rheumatoid arthritis (HCC)     Reviewed. Otherwise negative.   PAST SURGICAL HISTORY (PSH):  History reviewed. No pertinent surgical history.  Reviewed. Otherwise negative.   MEDICATIONS:  Prior to Admission medications   Medication Sig Start Date End Date Taking? Authorizing Provider  acetaminophen (TYLENOL) 325 MG tablet Take 2 tablets (650 mg total) by mouth every 4 (four) hours as needed (for pain scale < 4  OR  temperature  >/=  100.5 F). 02/07/22   McVey, Prudencio Pair, CNM  calcium carbonate (TUMS - DOSED IN MG ELEMENTAL CALCIUM) 500 MG chewable tablet Chew 2 tablets (400 mg of elemental calcium total) by mouth every 4 (four) hours as needed for indigestion. 02/07/22   McVey, Prudencio Pair, CNM  docusate sodium (COLACE) 100 MG capsule Take 1 capsule (100 mg total) by mouth daily. 02/07/22   McVey, Prudencio Pair, CNM  etanercept (ENBREL) 50 MG/ML injection Inject 50 mg into the skin once a week.    [provider]  ibuprofen  (ADVIL) 600 MG tablet Take 1 tablet (600 mg total) by mouth every 6 (six) hours as needed. 02/07/22   McVey, Prudencio Pair, CNM  Prenatal Vit-Fe Fumarate-FA (MULTIVITAMIN-PRENATAL) 27-0.8 MG TABS tablet Take 1 tablet by mouth daily at 12 noon.    [provider]     ALLERGIES:  No Known Allergies   SOCIAL HISTORY:  Social History   Socioeconomic History   Marital status: Unknown    Spouse name: Not on file   Number of children: Not on file   Years of education: Not on file   Highest education level: Not on file  Occupational History   Not on file  Tobacco Use   Smoking status: Never   Smokeless tobacco: Never  Substance and Sexual Activity   Alcohol use: Not on file   Drug use: Not on file   Sexual activity: Not on file  Other Topics Concern   Not on file  Social History Narrative   Not on file   Social Determinants of Health   Financial Resource Strain: Not on file  Food Insecurity: Not on file  Transportation Needs: Not on file  Physical Activity: Not on file  Stress: Not on file  Social Connections: Not on file  Intimate Partner Violence: Not on file     FAMILY HISTORY:  History reviewed. No pertinent family history.  Otherwise negative.   REVIEW OF SYSTEMS:  Review of Systems  Constitutional:  Positive for fever. Negative for chills.       + Decreased Appetite  HENT:  Negative for congestion and sore throat.   Respiratory:  Negative for cough and shortness of breath.   Cardiovascular:  Negative for chest pain and palpitations.  Gastrointestinal:  Positive for abdominal pain and nausea. Negative for constipation, diarrhea and vomiting.  Genitourinary:  Negative for dysuria and urgency.  All other systems reviewed and are negative.  VITAL SIGNS:  Temp:  [98.3 F (36.8 C)] 98.3 F (36.8 C) (05/24 0349) Pulse Rate:  [94-103] 94 (05/24 0507) Resp:  [16-18] 16 (05/24 0507) BP: (110-132)/(78-80) 110/80 (05/24 0507) SpO2:  [98 %] 98 % (05/24  0507) Weight:  [61.2 kg] 61.2 kg (05/24 0348)     Height: 5' 9.5" (176.5 cm) Weight: 61.2 kg BMI (Calculated): 19.66   PHYSICAL EXAM:  Physical Exam Vitals and nursing note reviewed. Exam conducted with a chaperone present.  Constitutional:      General: She is not in acute distress.    Appearance: She is well-developed. She is not ill-appearing.     Comments: Patient in NAD, breastfeeding infant   HENT:     Head: Normocephalic and atraumatic.  Eyes:     General: No scleral icterus.    Extraocular Movements: Extraocular movements intact.  Cardiovascular:     Rate and Rhythm: Regular rhythm. Tachycardia present.     Heart sounds: Normal heart sounds.  Pulmonary:     Effort: Pulmonary effort is normal. No respiratory distress.  Abdominal:     General: Abdomen is flat. There is no distension.     Palpations: Abdomen is soft.     Tenderness: There is abdominal tenderness in the right lower quadrant and suprapubic area. Positive signs include Rovsing's sign. Negative signs include McBurney's sign.     Comments: Abdomen is soft, she is more tender in the suprapubic region than RLQ consistent with anatomic location of her appendix on imaging, non-distended, no rebound/guarding   Genitourinary:    Comments: Deferred Skin:    General: Skin is warm and dry.     Coloration: Skin is not jaundiced.     Findings: No erythema.  Neurological:     General: No focal deficit present.     Mental Status: She is alert and oriented to person, place, and time.  Psychiatric:        Mood and Affect: Mood normal.        Behavior: Behavior normal.    INTAKE/OUTPUT:  This shift: No intake/output data recorded.  Last 2 shifts: @IOLAST2SHIFTS @  Labs:     Latest Ref Rng & Units 02/28/2022    3:47 AM 02/07/2022    3:57 PM  CBC  WBC 4.0 - 10.5 K/uL 12.5   18.3    Hemoglobin 12.0 - 15.0 g/dL 16.111.5   09.612.0    Hematocrit 36.0 - 46.0 % 37.3   37.5    Platelets 150 - 400 K/uL 352   275        Latest Ref  Rng & Units 02/28/2022    3:47 AM  CMP  Glucose 70 - 99 mg/dL [id number]    BUN 6 - 20 mg/dL 19    Creatinine 4.090.44 - 1.00 mg/dL 8.110.75    Sodium 914135 - 782145 mmol/L 138    Potassium 3.5 - 5.1 mmol/L 3.9    Chloride 98 - 111 mmol/L 105    CO2 22 - 32 mmol/L 23    Calcium 8.9 - 10.3 mg/dL 9.6    Total Protein 6.5 - 8.1 g/dL 7.7    Total  Bilirubin 0.3 - 1.2 mg/dL 1.0    Alkaline Phos 38 - 126 U/L 73    AST 15 - 41 U/L 23    ALT 0 - 44 U/L 24       Imaging studies:   CT Abdomen/Pelvis (02/28/2022) personally reviewed which shows dilated and inflamed appendix without abscess nor perforation, and radiologist report reviewed below:  IMPRESSION: Findings consistent with acute appendicitis. No definite abscess formation is noted.   17 mm low density with nodular enhancement is noted posteriorly in right hepatic lobe most consistent with hemangioma, although other pathology cannot be excluded When the patient is clinically stable and able to follow directions and hold their breath (preferably as an outpatient) further evaluation with dedicated abdominal MRI should be considered.   Assessment/Plan: (ICD-10's: K35.30) 36 y.o. female with abdominal pain, fever, leukocytosis foind to have acute uncomplicated appendicitis   - Will admit to general surgery   - Plan for laparoscopic appendectomy today with Dr Claudine Mouton pending OR/Anesthesia availability. I did discuss potential for conservative measures with Abx alone; however, given her pain, leukocytosis, and fever, I do think the more prudent decision is to proceed with surgery, she agrees.   - All risks, benefits, and alternatives to above procedure(s) were discussed with the patient, all of her questions were answered to her expressed satisfaction, patient expresses she wishes to proceed, and informed consent was obtained.    - NPO + IVF resuscitation  - IV Abx (Zosyn)  - Monitor abdominal examination  - I did offer pain medication/antiemetics but  patient declined  - Morning labs; CBC   - Hold DVT prophylaxis   All of the above findings and recommendations were discussed with the patient, and all of her questions were answered to her expressed satisfaction.  -- Lynden Oxford, PA-C [facility] 02/28/2022, 9:12 AM M-F: 7am - 4pm

## 2022-03-01 ENCOUNTER — Encounter: Payer: Self-pay | Admitting: Surgery

## 2022-03-01 DIAGNOSIS — K3580 Unspecified acute appendicitis: Secondary | ICD-10-CM | POA: Diagnosis not present

## 2022-03-01 LAB — CBC
HCT: 32 % — ABNORMAL LOW (ref 36.0–46.0)
Hemoglobin: 9.7 g/dL — ABNORMAL LOW (ref 12.0–15.0)
MCH: 24.1 pg — ABNORMAL LOW (ref 26.0–34.0)
MCHC: 30.3 g/dL (ref 30.0–36.0)
MCV: 79.6 fL — ABNORMAL LOW (ref 80.0–100.0)
Platelets: 286 10*3/uL (ref 150–400)
RBC: 4.02 MIL/uL (ref 3.87–5.11)
RDW: 13.2 % (ref 11.5–15.5)
WBC: 8.9 10*3/uL (ref 4.0–10.5)
nRBC: 0 % (ref 0.0–0.2)

## 2022-03-01 LAB — BASIC METABOLIC PANEL
Anion gap: 4 — ABNORMAL LOW (ref 5–15)
BUN: 15 mg/dL (ref 6–20)
CO2: 25 mmol/L (ref 22–32)
Calcium: 7.7 mg/dL — ABNORMAL LOW (ref 8.9–10.3)
Chloride: 106 mmol/L (ref 98–111)
Creatinine, Ser: 0.66 mg/dL (ref 0.44–1.00)
GFR, Estimated: >60 mL/min (ref >60)
Glucose, Bld: 104 mg/dL — ABNORMAL HIGH (ref 70–99)
Potassium: 3.8 mmol/L (ref 3.5–5.1)
Sodium: 135 mmol/L (ref 135–145)

## 2022-03-01 NOTE — Progress Notes (Signed)
Patient discharged home with family.  Discharge instructions, when to follow up, and medications reviewed with patient.  Patient verbalized understanding. Patient will be escorted out by auxiliary.   

## 2022-03-01 NOTE — Discharge Summary (Signed)
Physician Discharge Summary  Patient ID: Angela Atkinson MRN: [id number] DOB/AGE: 03/27/1986 36 y.o.  Admit date: 02/28/2022 Discharge date: 03/01/2022  Admission Diagnoses: Acute appendicitis  Discharge Diagnoses:  Principal Problem:   Acute appendicitis   Discharged Condition: good  Hospital Course: ED work-up led to acute appendicitis, laparoscopic appendectomy completed.  Overnight observation with excellent pain control, tolerance of diet.  Consults: None  Significant Diagnostic Studies: Radiology  Treatments: Laparoscopic appendectomy  Discharge Exam: Blood pressure (!) 91/54, pulse 82, temperature 98.8 F (37.1 C), temperature source Oral, resp. rate (!) 24, height 5' 9.5" (1.765 m), weight 61.2 kg, SpO2 98 %, currently breastfeeding. Incisions clean dry and intact.  Abdomen nondistended.  Patient appears well.  Disposition: Discharge disposition: 01-Home or Self Care       Discharge Instructions     Call MD for:  persistant nausea and vomiting   Complete by: As directed    Call MD for:  redness, tenderness, or signs of infection (pain, swelling, redness, odor or green/yellow discharge around incision site)   Complete by: As directed    Call MD for:  severe uncontrolled pain   Complete by: As directed    Diet - low sodium heart healthy   Complete by: As directed    Discharge wound care:   Complete by: As directed    Your incision was closed with Dermabond.  It is best to keep it clean and dry, it will tolerate a brief shower, but do not soak it or apply any creams or lotions to the incisions.  The Dermabond should gradually flake off over time.  Keep it open to air so you can evaluate your incisions.  Dermabond assists the underlying sutures to keep your incision closed and protected from infection.  Should you develop some drainage from your incision, some drops of drainage would be okay but if it persists continue to put keep a dry dressing over it.   Driving  Restrictions   Complete by: As directed    No driving until cleared after follow-up appointment.  Is not advised to drive while taking narcotic pain medications or in significant pain.   Increase activity slowly   Complete by: As directed    Lifting restrictions   Complete by: As directed    Strongly advised against any form of lifting greater than 15 pounds over the next 4 to 6 weeks.  This involves pushing/pulling movements as well.  After 4 weeks when may gradually engage in more activities remaining aware of any new pain/tenderness elicited, and avoiding those for the full duration of 6 weeks.  Walking is encouraged.  Climbing stairs with caution.      Allergies as of 03/01/2022   No Known Allergies      Medication List     TAKE these medications    acetaminophen 325 MG tablet Commonly known as: TYLENOL Take 2 tablets (650 mg total) by mouth every 4 (four) hours as needed (for pain scale < 4  OR  temperature  >/=  100.5 F).   calcium carbonate 500 MG chewable tablet Commonly known as: TUMS - dosed in mg elemental calcium Chew 2 tablets (400 mg of elemental calcium total) by mouth every 4 (four) hours as needed for indigestion.   docusate sodium 100 MG capsule Commonly known as: COLACE Take 1 capsule (100 mg total) by mouth daily.   Enbrel 50 MG/ML injection Generic drug: etanercept Inject 50 mg into the skin once a week.   ibuprofen 600  MG tablet Commonly known as: ADVIL Take 1 tablet (600 mg total) by mouth every 6 (six) hours as needed.   multivitamin-prenatal 27-0.8 MG Tabs tablet Take 1 tablet by mouth daily at 12 noon.               Discharge Care Instructions  (From admission, onward)           Start     Ordered   03/01/22 0000  Discharge wound care:       Comments: Your incision was closed with Dermabond.  It is best to keep it clean and dry, it will tolerate a brief shower, but do not soak it or apply any creams or lotions to the incisions.  The  Dermabond should gradually flake off over time.  Keep it open to air so you can evaluate your incisions.  Dermabond assists the underlying sutures to keep your incision closed and protected from infection.  Should you develop some drainage from your incision, some drops of drainage would be okay but if it persists continue to put keep a dry dressing over it.   03/01/22 0835            Follow-up Information     Campbell Lerner, MD Follow up in 3 week(s).   Specialty: General Surgery Why: s/p laparoscopic appendectomy Contact information: [address] [place] [address] [phone]                 Signed: Campbell Lerner, M.D., [facility] 03/01/2022, 8:35 AM

## 2022-03-02 LAB — SURGICAL PATHOLOGY

## 2022-03-06 NOTE — Anesthesia Postprocedure Evaluation (Signed)
Anesthesia Post Note  Patient: [name]  Procedure(s) Performed: APPENDECTOMY LAPAROSCOPIC (Abdomen)  Patient location during evaluation: PACU Anesthesia Type: General Level of consciousness: awake and alert Pain management: pain level controlled Vital Signs Assessment: post-procedure vital signs reviewed and stable Respiratory status: spontaneous breathing, nonlabored ventilation and respiratory function stable Cardiovascular status: blood pressure returned to baseline and stable Postop Assessment: no apparent nausea or vomiting Anesthetic complications: no   No notable events documented.   Last Vitals:  Vitals:   03/01/22 0341 03/01/22 0840  BP: (!) 91/54 (!) 90/56  Pulse: 82 81  Resp:  18  Temp: 37.1 C 36.7 C  SpO2: 98% 98%    Last Pain:  Vitals:   03/01/22 0840  TempSrc: Oral  PainSc:                  Angela Atkinson

## 2022-03-14 ENCOUNTER — Encounter: Payer: Self-pay | Admitting: Surgery

## 2022-07-27 ENCOUNTER — Encounter: Payer: Self-pay | Admitting: Gastroenterology

## 2022-09-04 ENCOUNTER — Ambulatory Visit (INDEPENDENT_AMBULATORY_CARE_PROVIDER_SITE_OTHER): Payer: BC Managed Care – PPO | Admitting: Gastroenterology

## 2022-09-04 ENCOUNTER — Encounter: Payer: Self-pay | Admitting: Gastroenterology

## 2022-09-04 VITALS — HR 78 | Ht 70.0 in | Wt 142.0 lb

## 2022-09-04 DIAGNOSIS — K219 Gastro-esophageal reflux disease without esophagitis: Secondary | ICD-10-CM | POA: Insufficient documentation

## 2022-09-04 DIAGNOSIS — R1310 Dysphagia, unspecified: Secondary | ICD-10-CM | POA: Diagnosis not present

## 2022-09-04 NOTE — Progress Notes (Addendum)
09/04/2022 Angela Atkinson [id number] [birth year]   HISTORY OF PRESENT ILLNESS: This is a 36 year old female who is new to our office.  She is here today as a self-referral for evaluation of dysphagia and reflux.  She says that she usually has acid reflux issues when she is pregnant starting around 30 weeks and she usually lets it get very bad before she will finally take something like Prilosec.  Then she will take it regularly while she is pregnant, but and then when she gives birth the symptoms usually resolve.  The past 1 to 2 months, however, she has had intermittent acid reflux and over the past couple months has maybe taken Prilosec twice.  She has also though noticed that certain foods seem to get stuck when going down, foods such as bread, rice, meat, etc.  Says it also seems that the food is slow to go down and she is more aware of it.  Says her mom has a history of what sounds like esophageal stricture requiring dilation.  Her mom is a patient of Dr. Woodward Ku.  Of note, patient is still breast-feeding her infant.  Past Medical History:  Diagnosis Date   Rheumatoid arthritis ([place])    Past Surgical History:  Procedure Laterality Date   LAPAROSCOPIC APPENDECTOMY N/A 02/28/2022   Procedure: APPENDECTOMY LAPAROSCOPIC;  Surgeon: Ronny Bacon, MD;  Location: [facility] ORS;  Service: General;  Laterality: N/A;    reports that she has never smoked. She has never used smokeless tobacco. No history on file for alcohol use and drug use. family history is not on file. No Known Allergies    Outpatient Encounter Medications as of 09/04/2022  Medication Sig   etanercept (ENBREL) 50 MG/ML injection Inject 50 mg into the skin once a week.   Prenatal Vit-Fe Fumarate-FA (MULTIVITAMIN-PRENATAL) 27-0.8 MG TABS tablet Take 1 tablet by mouth daily at 12 noon.   calcium carbonate (TUMS - DOSED IN MG ELEMENTAL CALCIUM) 500 MG chewable tablet Chew 2 tablets (400 mg of elemental calcium total) by  mouth every 4 (four) hours as needed for indigestion. (Patient not taking: Reported on 02/28/2022)   [DISCONTINUED] acetaminophen (TYLENOL) 325 MG tablet Take 2 tablets (650 mg total) by mouth every 4 (four) hours as needed (for pain scale < 4  OR  temperature  >/=  100.5 F).   [DISCONTINUED] docusate sodium (COLACE) 100 MG capsule Take 1 capsule (100 mg total) by mouth daily. (Patient not taking: Reported on 02/28/2022)   [DISCONTINUED] ibuprofen (ADVIL) 600 MG tablet Take 1 tablet (600 mg total) by mouth every 6 (six) hours as needed.   No facility-administered encounter medications on file as of 09/04/2022.     REVIEW OF SYSTEMS  : All other systems reviewed and negative except where noted in the History of Present Illness.   PHYSICAL EXAM: Pulse 78   Ht _0  (1.778 m)   Wt 142 lb (64.4 kg)   SpO2 98%   BMI 20.37 kg/m  General: Well developed white female in no acute distress Head: Normocephalic and atraumatic Eyes:  Sclerae anicteric, conjunctiva pink. Ears: Normal auditory acuity Lungs: Clear throughout to auscultation; no W/R/R. Heart: Regular rate and rhythm; no M/R/G. Musculoskeletal: Symmetrical with no gross deformities  Skin: No lesions on visible extremities Extremities: No edema  Neurological: Alert oriented x 4, grossly nonfocal Psychological:  Alert and cooperative. Normal mood and affect  ASSESSMENT AND PLAN: *Dysphagia and GERD: Dysphagia to certain solid foods over the past couple of  months.  Has reflux while pregnant, but then just occasionally otherwise.  ? If she has esophagitis from uncontrolled reflux versus a stricture or EOE.  She only takes Prilosec as needed at this point.  If she is going to need an EGD then she would like it done by the end of the year as they have met their deductible.  Will schedule for EGD with possible dilation with Dr. Silverio Decamp.  The risks, benefits, and alternatives to EGD with dilation were discussed with the patient and she  consents to proceed.   CC:  No ref. provider found

## 2022-09-04 NOTE — Patient Instructions (Signed)
You have been scheduled for an endoscopy. Please follow written instructions given to you at your visit today. If you use inhalers (even only as needed), please bring them with you on the day of your procedure.   Due to recent changes in healthcare laws, you may see the results of your imaging and laboratory studies on MyChart before your provider has had a chance to review them.  We understand that in some cases there may be results that are confusing or concerning to you. Not all laboratory results come back in the same time frame and the provider may be waiting for multiple results in order to interpret others.  Please give [place] 48 hours in order for your provider to thoroughly review all the results before contacting the office for clarification of your results.   _____________________________________________________  If you are age 81 or older, your body mass index should be between 23-30. Your Body mass index is 20.37 kg/m. If this is out of the aforementioned range listed, please consider follow up with your Primary Care Provider.  If you are age 51 or younger, your body mass index should be between 19-25. Your Body mass index is 20.37 kg/m. If this is out of the aformentioned range listed, please consider follow up with your Primary Care Provider.   _____________________________________________________  The [facility] providers would like to encourage you to use [facility] to communicate with providers for non-urgent requests or questions.  Due to long hold times on the telephone, sending your provider a message by [facility] may be a faster and more efficient way to get a response.  Please allow 48 business hours for a response.  Please remember that this is for non-urgent requests.  _____________________________________________________   Thank you for choosing me and  Gastroenterology.  Doug Sou PA

## 2022-09-05 ENCOUNTER — Telehealth: Payer: Self-pay | Admitting: Gastroenterology

## 2022-09-05 NOTE — Telephone Encounter (Signed)
Inbound call from patient stating that she is scheduled for an EGD on 12/1 with Dr. Lavon Paganini and is requesting a call back to discuss if after her procedure if her husband can bring their 19 month old son back to recovery to nurse him. Please advise.

## 2022-09-05 NOTE — Telephone Encounter (Signed)
Return call to patient. Informed patient that turn around time in recovery is typically about 20 min as long as all goes well. If baby should need to be fed during that time, then mother should pump and provide feeding via bottle as she may not be fully recovered from anesthesia at that time. Mother voiced understanding and states that she will bring a bottle with her/ spouse in the case that feeding is needed during the time that she is here for her procedure.

## 2022-09-07 ENCOUNTER — Encounter: Payer: Self-pay | Admitting: Gastroenterology

## 2022-09-07 ENCOUNTER — Ambulatory Visit (AMBULATORY_SURGERY_CENTER): Payer: BC Managed Care – PPO | Admitting: Gastroenterology

## 2022-09-07 VITALS — BP 101/68 | HR 77 | Temp 98.4°F | Resp 16 | Ht 70.0 in | Wt 142.0 lb

## 2022-09-07 DIAGNOSIS — R1310 Dysphagia, unspecified: Secondary | ICD-10-CM

## 2022-09-07 DIAGNOSIS — K2100 Gastro-esophageal reflux disease with esophagitis, without bleeding: Secondary | ICD-10-CM

## 2022-09-07 DIAGNOSIS — K200 Eosinophilic esophagitis: Secondary | ICD-10-CM

## 2022-09-07 DIAGNOSIS — K449 Diaphragmatic hernia without obstruction or gangrene: Secondary | ICD-10-CM

## 2022-09-07 DIAGNOSIS — K209 Esophagitis, unspecified without bleeding: Secondary | ICD-10-CM

## 2022-09-07 DIAGNOSIS — K219 Gastro-esophageal reflux disease without esophagitis: Secondary | ICD-10-CM | POA: Diagnosis not present

## 2022-09-07 LAB — POCT URINE PREGNANCY: Preg Test, Ur: NEGATIVE

## 2022-09-07 MED ORDER — OMEPRAZOLE 40 MG PO CPDR: 40.0000 mg | DELAYED_RELEASE_CAPSULE | Freq: Every day | ORAL | 1 refills | Status: AC

## 2022-09-07 MED ORDER — SODIUM CHLORIDE 0.9 % IV SOLN: 500.0000 mL | INTRAVENOUS | Status: DC

## 2022-09-07 NOTE — Op Note (Signed)
[facility] Patient Name: Angela GeroldKatherine Atkinson Procedure Date: 09/07/2022 1:41 PM MRN: [id number] Endoscopist: Napoleon FormKavitha V. Emlyn Maves , MD, [phone] Age: [age] Referring MD:  Date of Birth: [birth year] Gender: Female Account #: [id number] Procedure:                Upper GI endoscopy Indications:              Dysphagia, Suspected gastro-esophageal reflux                            disease Medicines:                Monitored Anesthesia Care Procedure:                Pre-Anesthesia Assessment:                           - Prior to the procedure, a History and Physical                            was performed, and patient medications and                            allergies were reviewed. The patient's tolerance of                            previous anesthesia was also reviewed. The risks                            and benefits of the procedure and the sedation                            options and risks were discussed with the patient.                            All questions were answered, and informed consent                            was obtained. Prior Anticoagulants: The patient has                            taken no anticoagulant or antiplatelet agents. ASA                            Grade Assessment: II - A patient with mild systemic                            disease. After reviewing the risks and benefits,                            the patient was deemed in satisfactory condition to                            undergo the procedure.  After obtaining informed consent, the endoscope was                            passed under direct vision. Throughout the                            procedure, the patient's blood pressure, pulse, and                            oxygen saturations were monitored continuously. The                            Endoscope was introduced through the mouth, and                            advanced to the second part of duodenum. The  upper                            GI endoscopy was accomplished without difficulty.                            The patient tolerated the procedure well. Scope In: Scope Out: Findings:                 LA Grade B (one or more mucosal breaks greater than                            5 mm, not extending between the tops of two mucosal                            folds) esophagitis with no bleeding was found 37 to                            38 cm from the incisors. Biopsies were obtained                            from the proximal and distal esophagus with cold                            forceps for histology of suspected eosinophilic                            esophagitis.                           No endoscopic abnormality was evident in the                            esophagus to explain the patient's complaint of                            dysphagia. It was decided, however, to proceed with  dilation of the entire esophagus. The scope was                            withdrawn. Dilation was performed with a Maloney                            dilator with no resistance at 52 Fr. The dilation                            site was examined following endoscope reinsertion                            and showed no change.                           A 2 cm hiatal hernia was present.                           The stomach was normal.                           The cardia and gastric fundus were normal on                            retroflexion.                           The examined duodenum was normal. Complications:            No immediate complications. Estimated Blood Loss:     Estimated blood loss: none. Impression:               - LA Grade B reflux esophagitis with no bleeding.                           - No endoscopic esophageal abnormality to explain                            patient's dysphagia. Esophagus dilated. Dilated.                           - 2 cm hiatal  hernia.                           - Normal stomach.                           - Normal examined duodenum.                           - Biopsies were taken with a cold forceps for                            evaluation of eosinophilic esophagitis. Recommendation:           - Patient has a contact number available for  emergencies. The signs and symptoms of potential                            delayed complications were discussed with the                            patient. Return to normal activities tomorrow.                            Written discharge instructions were provided to the                            patient.                           - Resume previous diet.                           - Continue present medications.                           - Await pathology results.                           - Follow an antireflux regimen.                           - Use Prilosec (omeprazole) 40 mg PO daily. Rx for                            90 days with 1 refill                           - Follow up in GI office in 4 months, please call                            to schedule appointment Napoleon Form, MD 09/07/2022 2:03:36 PM This report has been signed electronically.

## 2022-09-07 NOTE — Progress Notes (Signed)
Called to room to assist during endoscopic procedure.  Patient ID and intended procedure confirmed with present staff. Received instructions for my participation in the procedure from the performing physician.  

## 2022-09-07 NOTE — Progress Notes (Signed)
Please refer to office visit note 09/04/22 by Doug Sou. No additional changes in H&P Patient is appropriate for planned procedure(s) and anesthesia in an ambulatory setting  K. Scherry Ran , MD [phone]

## 2022-09-07 NOTE — Progress Notes (Signed)
Report given to PACU, vss 

## 2022-09-07 NOTE — Progress Notes (Signed)
Cell phone off per pt  

## 2022-09-07 NOTE — Patient Instructions (Addendum)
-   Continue present medications. - Await pathology results. - Follow an antireflux regimen. - Use Prilosec (omeprazole) 40 mg PO daily. Rx for 90 days with 1 refill - Follow up in GI office in 4 months, please call to schedule appointment -Handout on Hiatal Hernia and Esophagitis provided   YOU HAD AN ENDOSCOPIC PROCEDURE TODAY AT THE [facility]:   Refer to the procedure report that was given to you for any specific questions about what was found during the examination.  If the procedure report does not answer your questions, please call your gastroenterologist to clarify.  If you requested that your care partner not be given the details of your procedure findings, then the procedure report has been included in a sealed envelope for you to review at your convenience later.  YOU SHOULD EXPECT: Some feelings of bloating in the abdomen. Passage of more gas than usual.  Walking can help get rid of the air that was put into your GI tract during the procedure and reduce the bloating. If you had a lower endoscopy (such as a colonoscopy or flexible sigmoidoscopy) you may notice spotting of blood in your stool or on the toilet paper. If you underwent a bowel prep for your procedure, you may not have a normal bowel movement for a few days.  Please Note:  You might notice some irritation and congestion in your nose or some drainage.  This is from the oxygen used during your procedure.  There is no need for concern and it should clear up in a day or so.  SYMPTOMS TO REPORT IMMEDIATELY:  Following upper endoscopy (EGD)  Vomiting of blood or coffee ground material  New chest pain or pain under the shoulder blades  Painful or persistently difficult swallowing  New shortness of breath  Fever of 100F or higher  Black, tarry-looking stools  For urgent or emergent issues, a gastroenterologist can be reached at any hour by calling [phone]. Do not use MyChart messaging for urgent concerns.     DIET:  We do recommend a small meal at first, but then you may proceed to your regular diet.  Drink plenty of fluids but you should avoid alcoholic beverages for 24 hours.  ACTIVITY:  You should plan to take it easy for the rest of today and you should NOT DRIVE or use heavy machinery until tomorrow (because of the sedation medicines used during the test).    FOLLOW UP: Our staff will call the number listed on your records the next business day following your procedure.  We will call around 7:15- 8:00 am to check on you and address any questions or concerns that you may have regarding the information given to you following your procedure. If we do not reach you, we will leave a message.     If any biopsies were taken you will be contacted by phone or by letter within the next 1-3 weeks.  Please call us at [phone] if you have not heard about the biopsies in 3 weeks.    SIGNATURES/CONFIDENTIALITY: You and/or your care partner have signed paperwork which will be entered into your electronic medical record.  These signatures attest to the fact that that the information above on your After Visit Summary has been reviewed and is understood.  Full responsibility of the confidentiality of this discharge information lies with you and/or your care-partner.

## 2022-09-10 ENCOUNTER — Telehealth: Payer: Self-pay

## 2022-09-10 NOTE — Telephone Encounter (Signed)
  Follow up Call-     09/07/2022    1:18 PM  Call back number  Post procedure Call Back phone  # [phone]  Permission to leave phone message Yes     Patient questions:  Do you have a fever, pain , or abdominal swelling? No. Patient reports feeling pressure at site of lower rib area, spoke to on call Dr over the weekend.  States symptoms improving but not completed relieved. Advised pt to continue taking Prilosec as prescribed and if symptoms don't improve contact [facility]. Pain Score  0 *  Have you tolerated food without any problems? Yes.    Have you been able to return to your normal activities? Yes.    Do you have any questions about your discharge instructions: Diet   No. Medications  No. Follow up visit  No.  Do you have questions or concerns about your Care? No.  Actions: * If pain score is 4 or above: No action needed, pain <4.

## 2022-09-13 ENCOUNTER — Encounter: Payer: Self-pay | Admitting: Gastroenterology

## 2022-11-25 ENCOUNTER — Encounter: Payer: Self-pay | Admitting: Gastroenterology

## 2023-04-06 IMAGING — US US OB COMP +14 WK
1 series · 15 of 28 positions shown · non-contrast
Comparison: none

CLINICAL DATA: Third trimester pregnancy with vaginal bleeding. No
prenatal care.

EXAM:
OBSTETRICAL ULTRASOUND >14 WKS

[Series 1: us ob comp + 14 wk · 47 acquisitions, 15 frames shown]
[im 1/47]
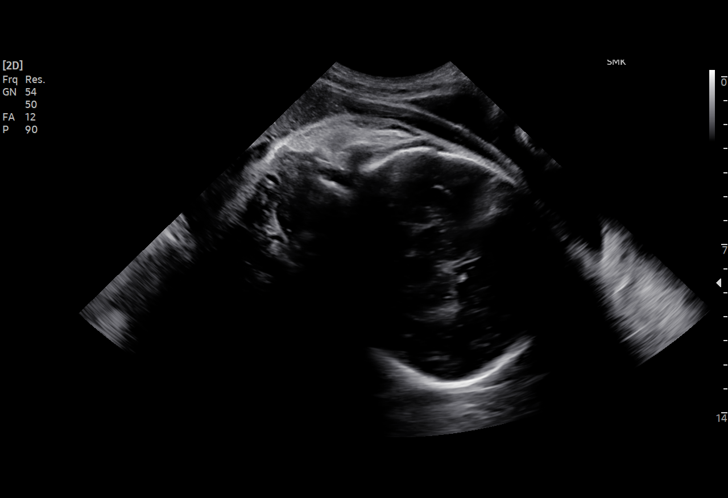
[im 4/47]
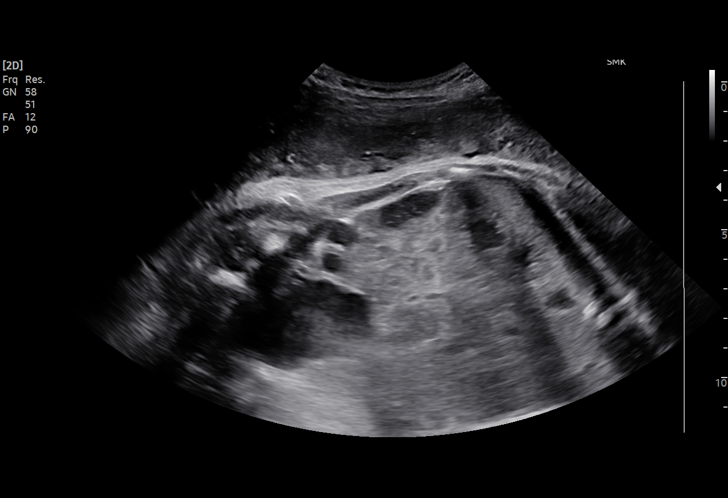
[im 7/47]
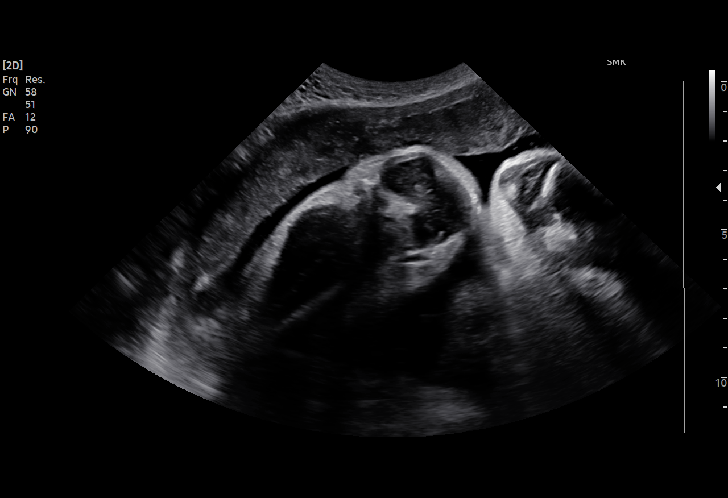
[im 11/47]
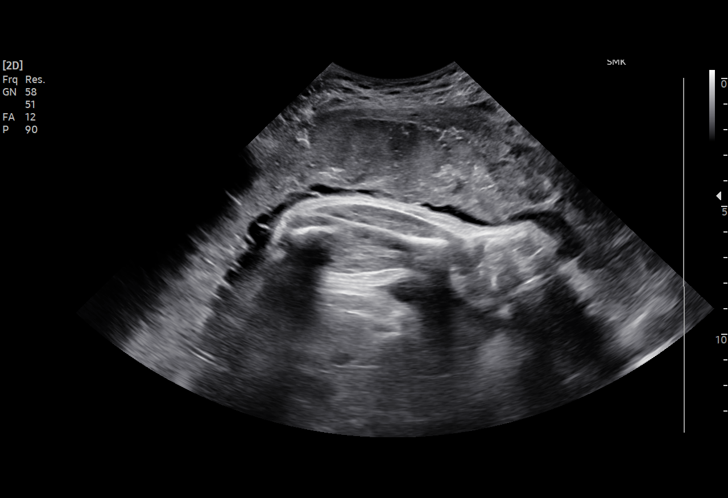
[im 14/47]
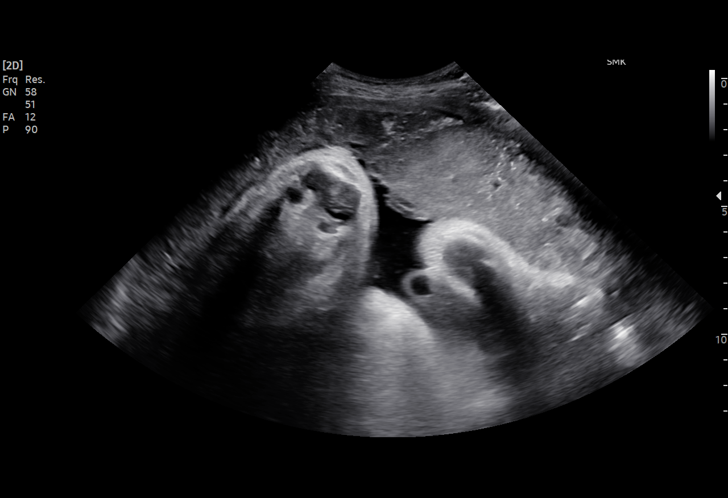
[im 18/47]
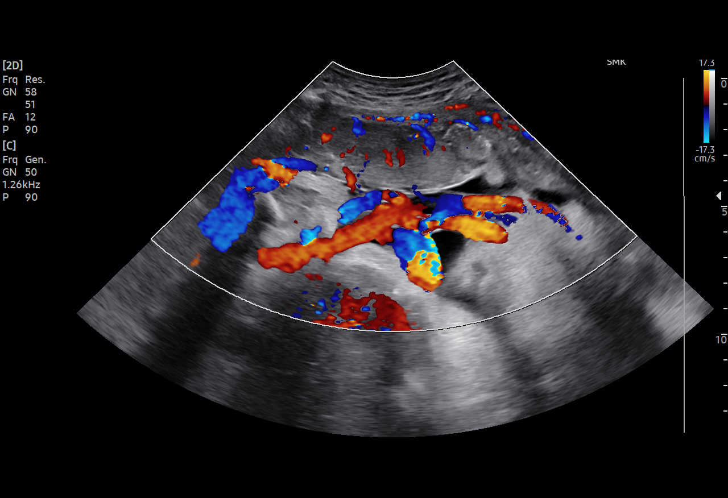
[im 21/47]
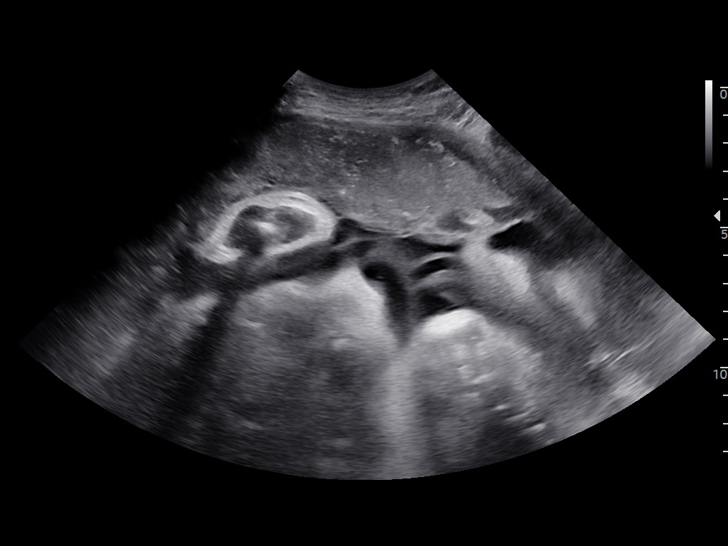
[im 24/47]
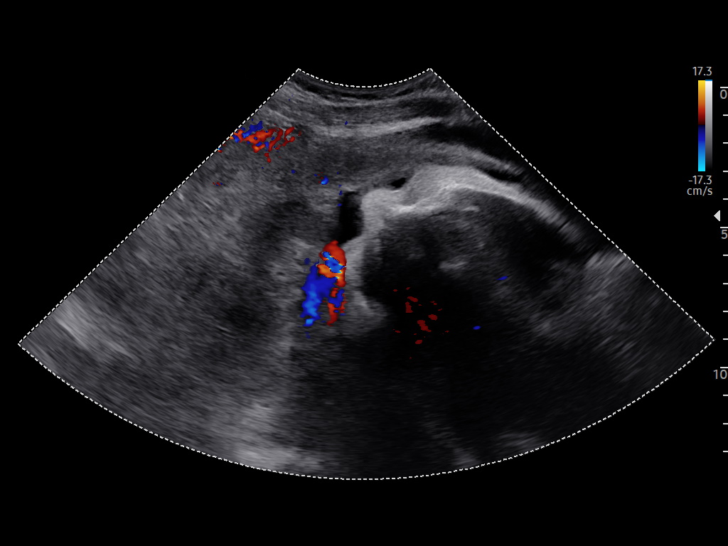
[im 26/47]
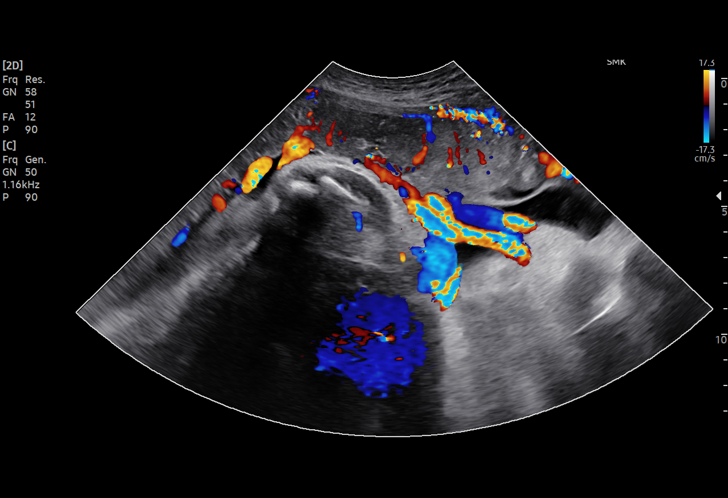
[im 29/47]
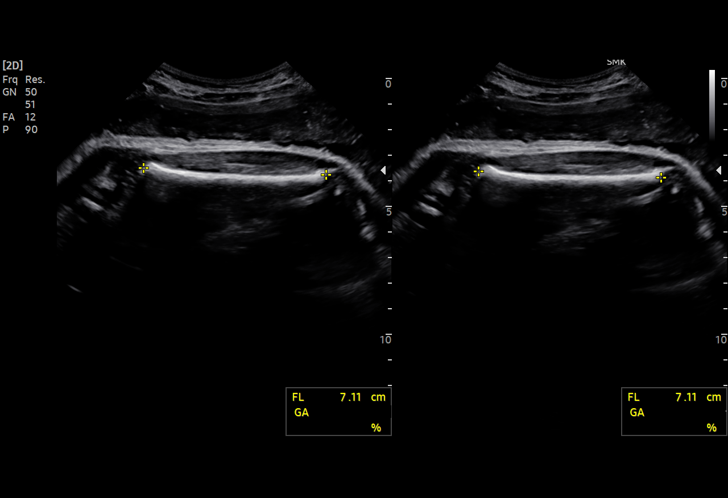
[im 33/47]
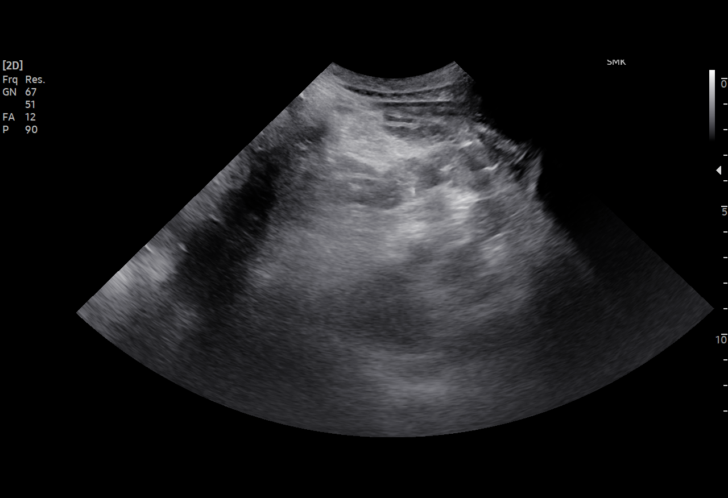
[im 36/47]
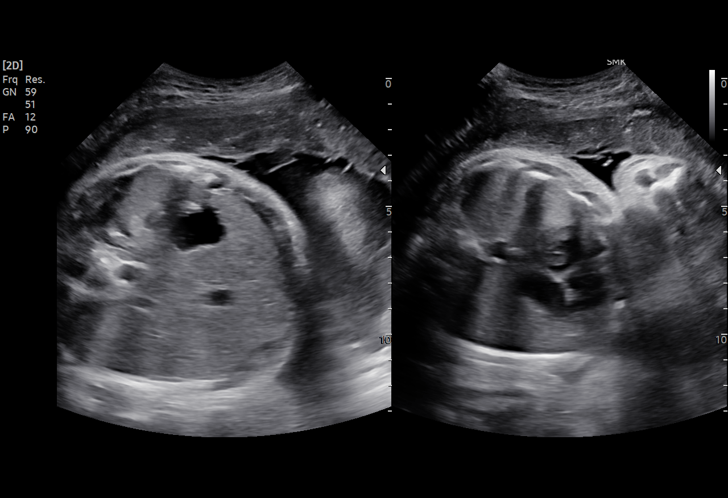
[im 40/47]
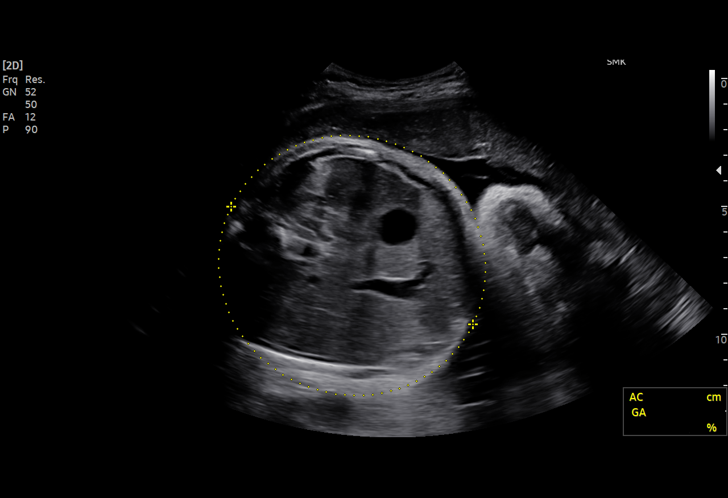
[im 43/47]
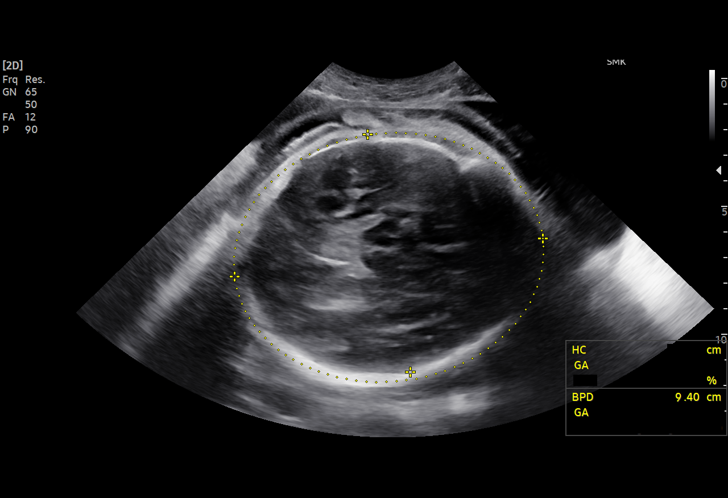
[im 47/47]
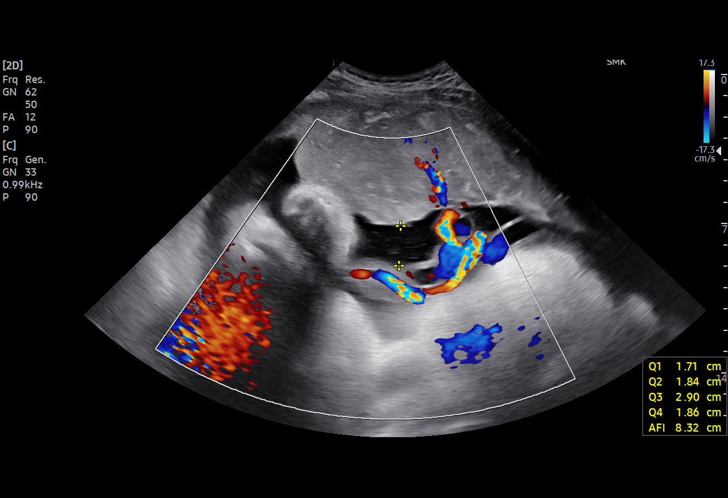

[15 of 28 positions shown; findings below may reference images not displayed]

FINDINGS: Number of Fetuses: 1

Heart Rate:  152 bpm

Movement: Yes

Presentation: Cephalic

Previa: No

Placental Location: Anterior; no abruption visualized

Amniotic Fluid (Subjective): Within normal limits

Amniotic Fluid (Objective):

Vertical pocket = 2.9cm

AFI = 8.3 cm (5%ile= 7.5 cm, 95%= 24.4 cm for 37 wks)

FETAL BIOMETRY

BPD: 9.4cm 38w 2d

HC:   34.3cm 39w 5d

AC:   32.2cm 36w 1d

FL:   7.1cm 36Theodores Makmoery

Current Mean GA: 37w 4d US EDC: 02/24/2022

Assigned GA:  37w 2d Assigned EDC: 02/26/2022

Estimated Fetal Weight:  3,048g 61%ile

FETAL ANATOMY

Lateral Ventricles: Not visualized

Thalami/CSP: Not visualized

Posterior Fossa:  Not visualized

Nuchal Region: Not visualized   NFT= N/A > 20 WKS

Upper Lip: Not visualized

Spine: Not visualized

4 Chamber Heart on Left: Appears normal

LVOT: Not visualized

RVOT: Not visualized

Stomach on Left: Appears normal

3 Vessel Cord: Appears normal

Cord Insertion site: Not visualized

Kidneys: Not visualized

Bladder: Appears normal

Extremities: Not visualized

Sex: Not Visualized

Technically difficult due to: Advanced gestational age, fetal
position, and active labor

Maternal Findings:

Cervix:  Not evaluated (>34 wks)
IMPRESSION: Assigned GA currently 37 weeks 2 days. Appropriate fetal growth,
with EFW currently at 61 %ile.

Amniotic fluid volume within normal limits, with AFI 8.3 cm.

No placental abruption or previa visualized.

## 2023-04-27 IMAGING — US US PELVIS COMPLETE
1 series · 14 of 25 positions shown · non-contrast
Comparison: None Available.

CLINICAL DATA: Severe pelvic pain for 12 hours. Three weeks
postpartum, evaluate for retained products or endometritis.

EXAM:
TRANSABDOMINAL ULTRASOUND OF PELVIS
TECHNIQUE: Transabdominal ultrasound examination of the pelvis was performed
including evaluation of the uterus, ovaries, adnexal regions, and
pelvic cul-de-sac.

[Series 1: us pelvis (transabdominal only) · 14 of 52 slices shown]
[im 1/52]
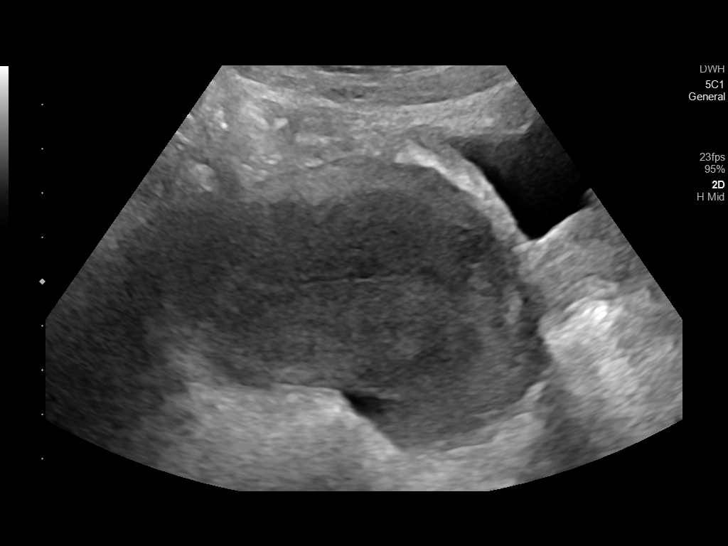
[im 5/52]
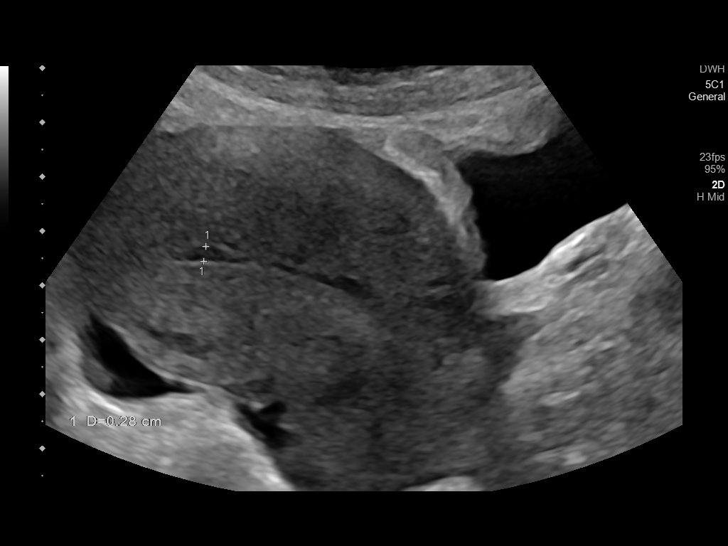
[im 9/52]
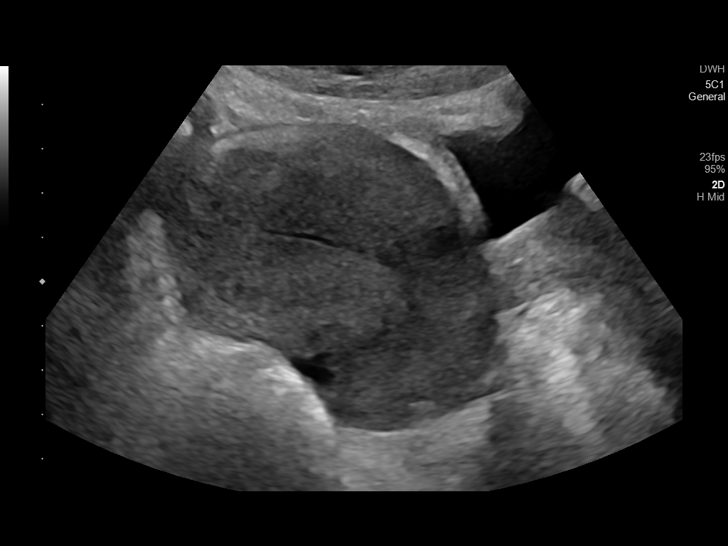
[im 13/52]
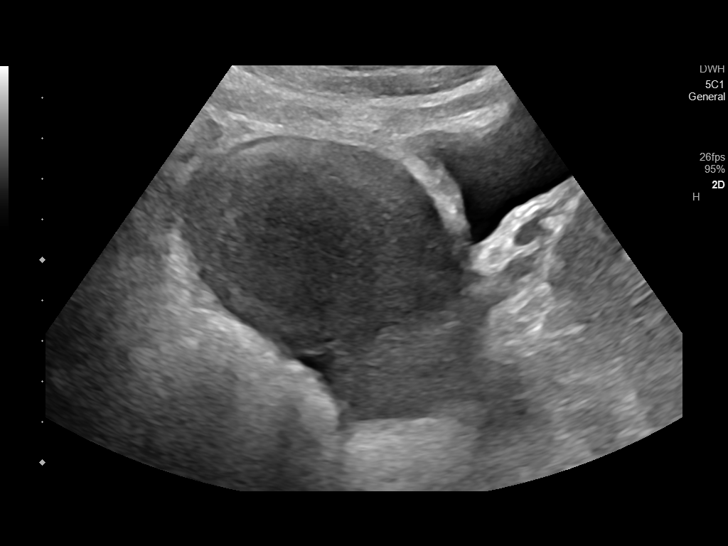
[im 18/52]
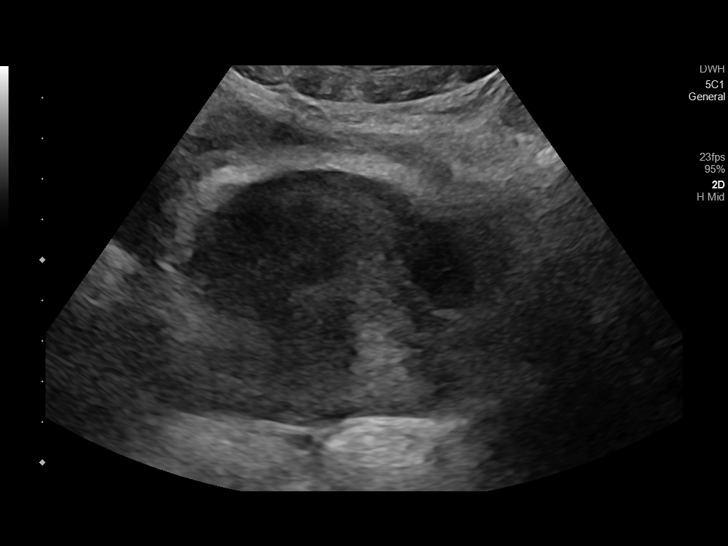
[im 20/52]
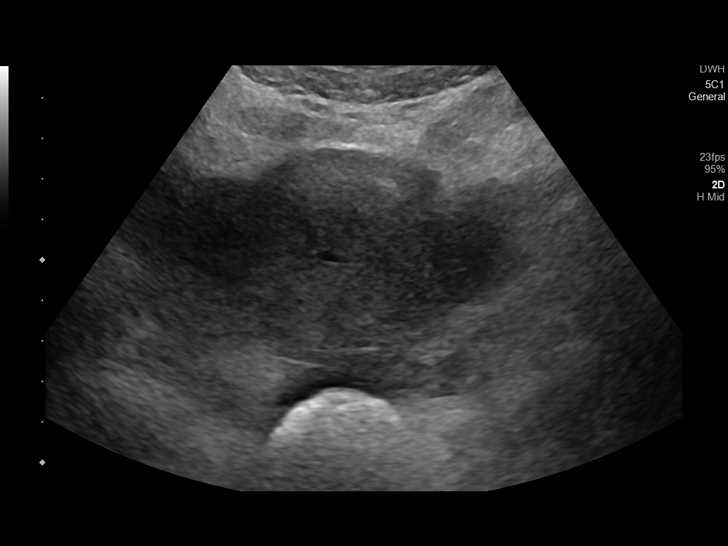
[im 24/52]
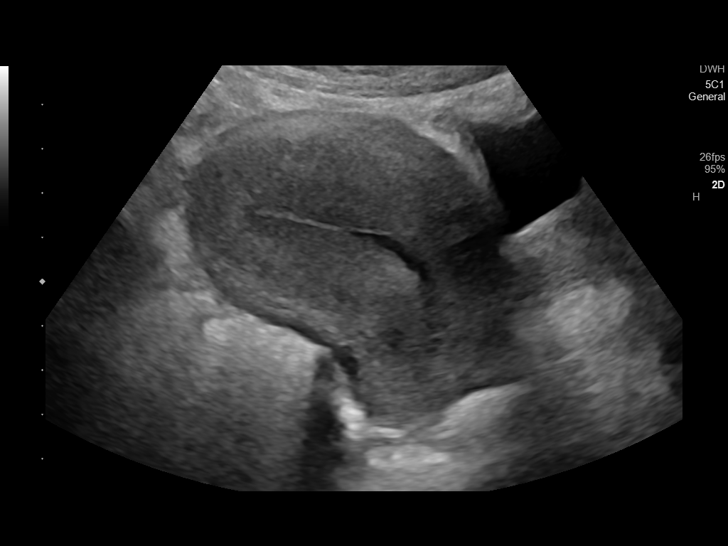
[im 28/52]
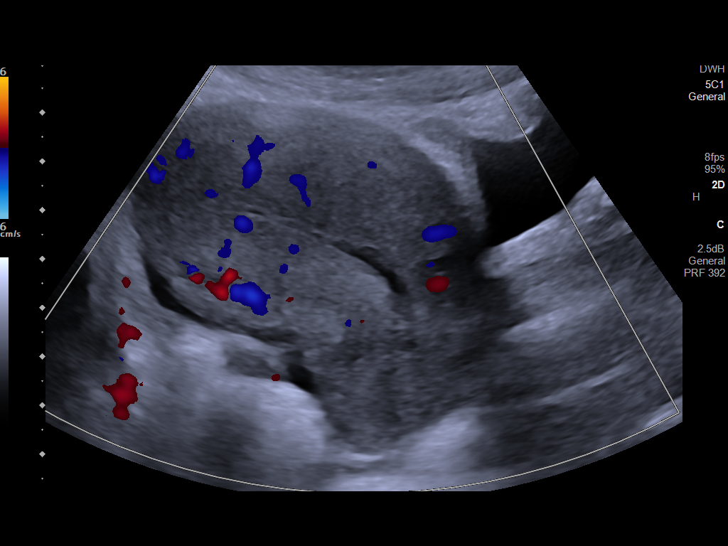
[im 32/52]
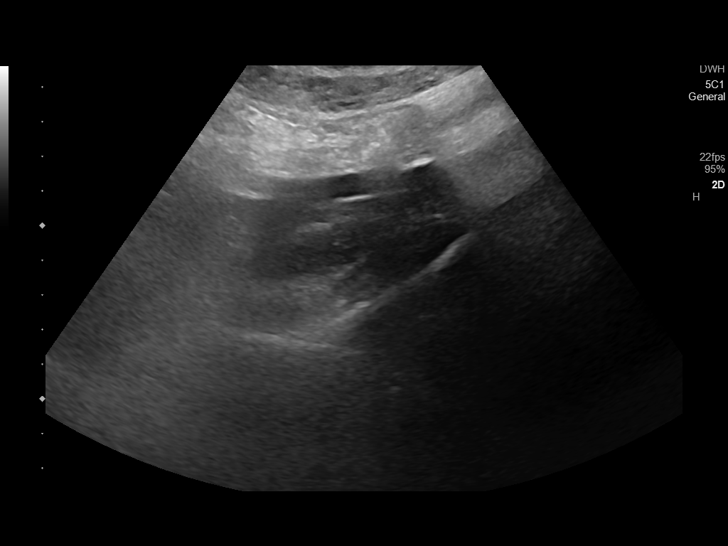
[im 35/52]
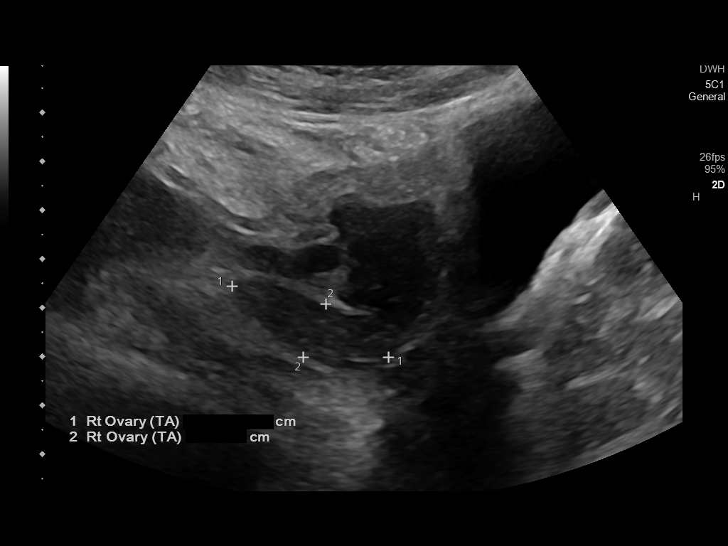
[im 39/52]
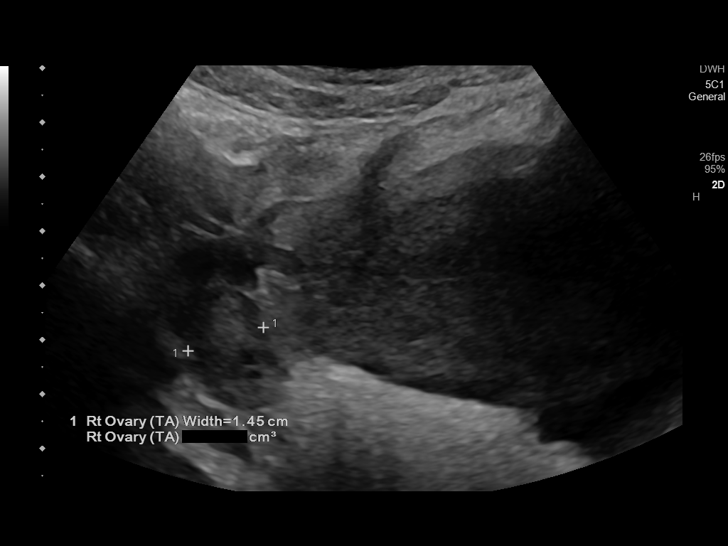
[im 43/52]
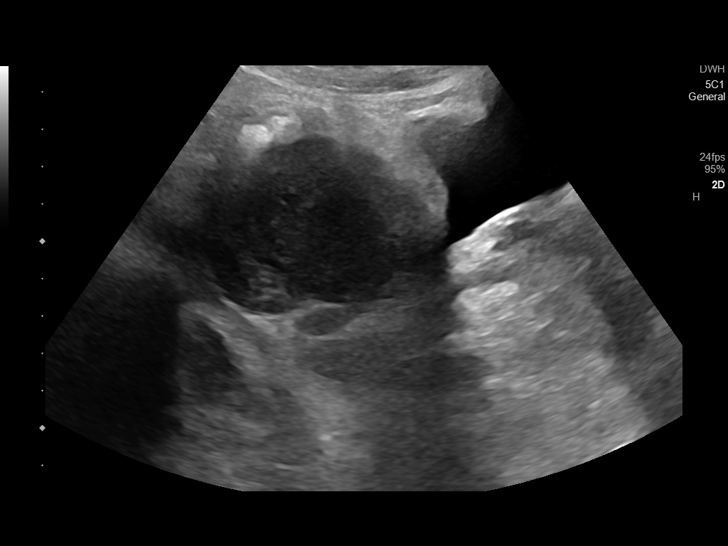
[im 47/52]
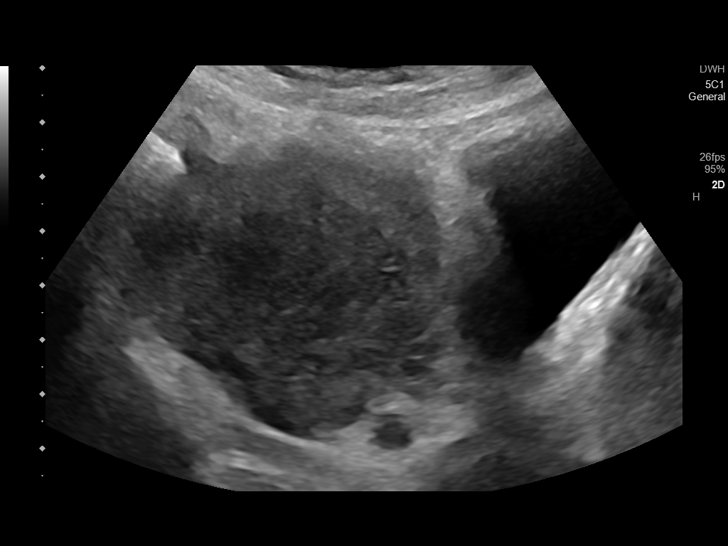
[im 52/52]
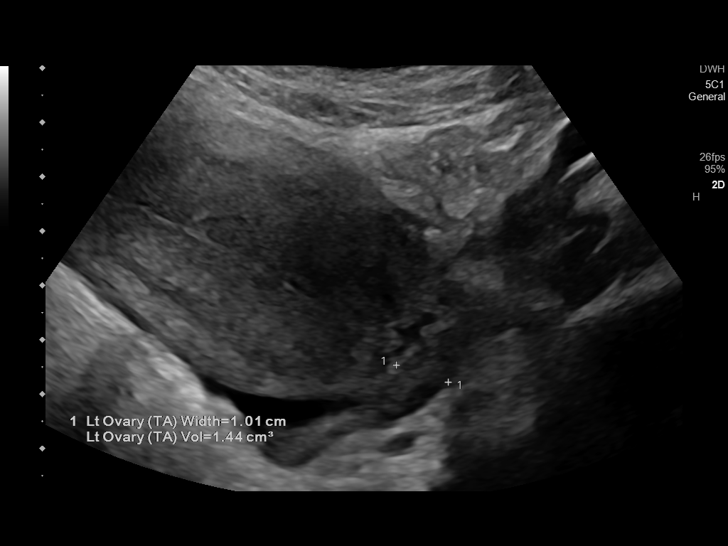

[14 of 25 positions shown; findings below may reference images not displayed]

FINDINGS: Uterus

Measurements: 8 x 5 x 8 = volume: 180 mL. No fibroids or other mass
visualized.

Endometrium

Endometrial fluid measuring 3-4 mm. No visible gas or abnormal
vascularity. No endometrial thickening or mass.

Right ovary

Measurements: 35 x 12 x 15 mm = volume: 3 mL. Normal appearance/no
adnexal mass.

Left ovary

Measurements: 25 x 11 x 10 mm = volume: 1.4 mL. Normal appearance/no
adnexal mass.

Other findings:  No abnormal free fluid.
IMPRESSION: Small volume endometrial fluid which is nonspecific postpartum. No
endometrial thickening or mass to imply or retained products.

## 2023-04-27 IMAGING — CT CT ABD-PELV W/ CM
2 of 4 series · 16 of 46 positions shown, 18 images · IV contrast (APPLIED)
Comparison: None Available.

CLINICAL DATA: Right lower quadrant abdominal pain.

EXAM:
CT ABDOMEN AND PELVIS WITH CONTRAST
TECHNIQUE: Multidetector CT imaging of the abdomen and pelvis was performed
using the standard protocol following bolus administration of
intravenous contrast.

[Series 2: routine abd/pel with · axial · 0.70mm/px · z∈[-1063,-623]mm · 13 of 98 slices shown, 15 images]
[im 5/98  soft-tissue]
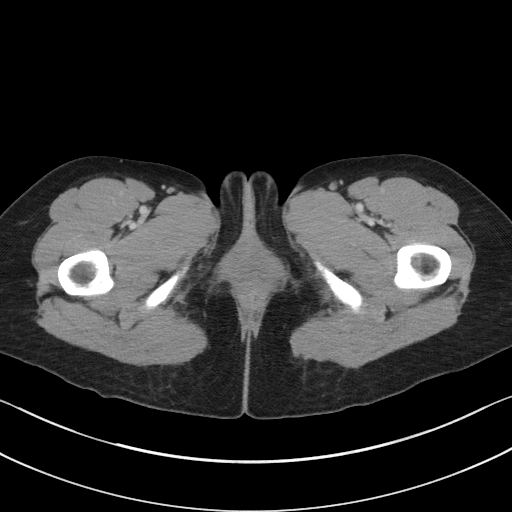
[im 5/98  bone]
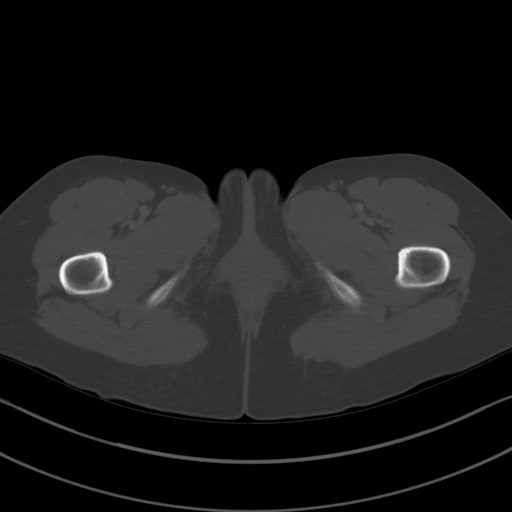
[im 14/98  soft-tissue]
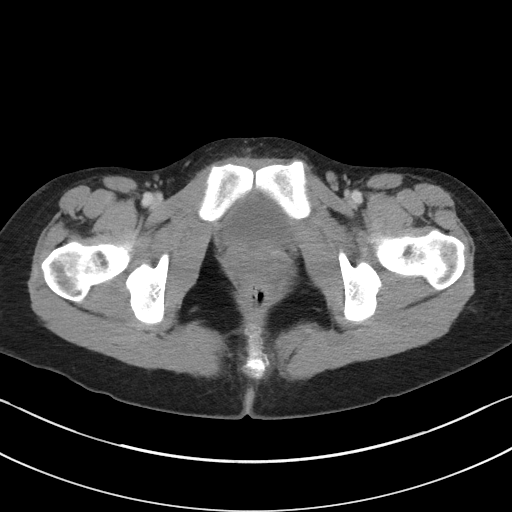
[im 23/98  soft-tissue]
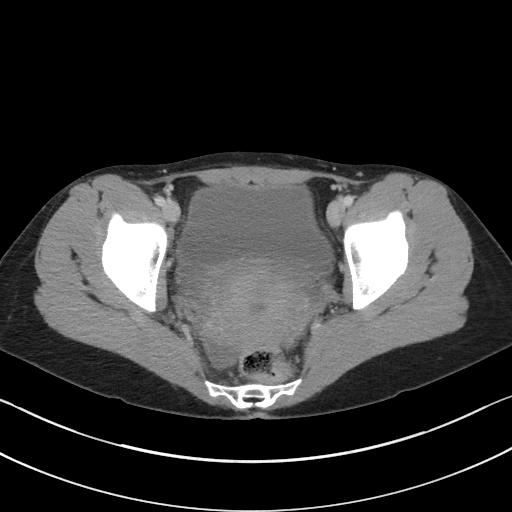
[im 27/98  soft-tissue]
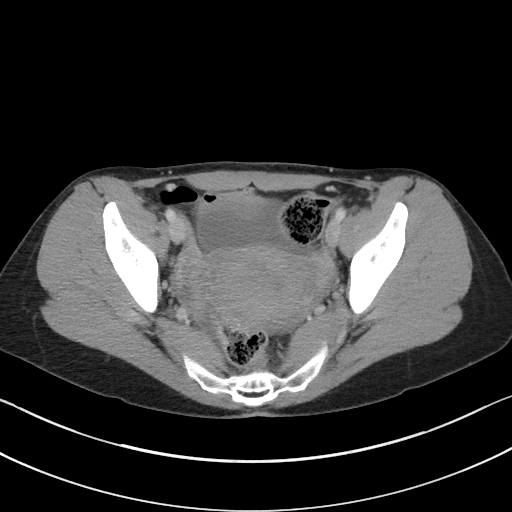
[im 36/98  soft-tissue]
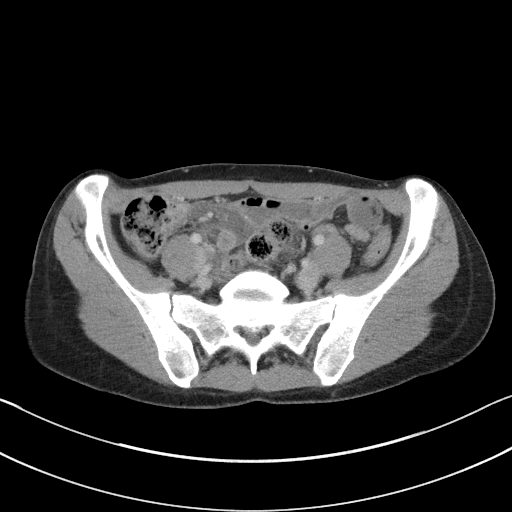
[im 40/98  soft-tissue]
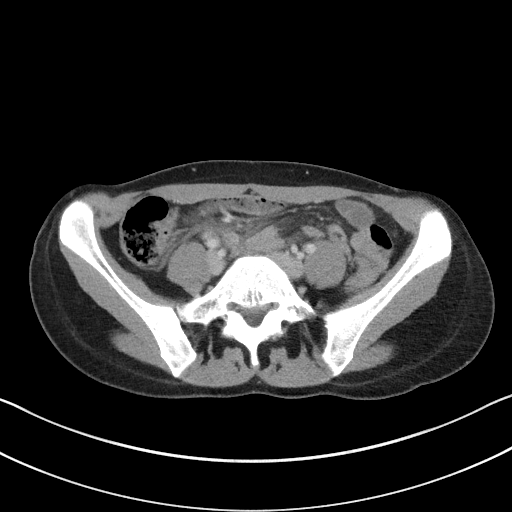
[im 49/98  soft-tissue]
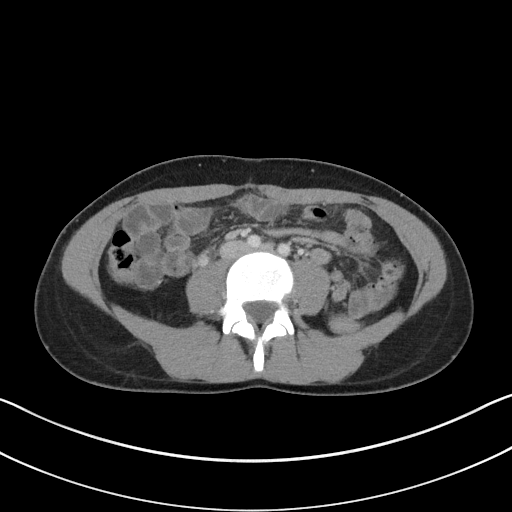
[im 58/98  soft-tissue]
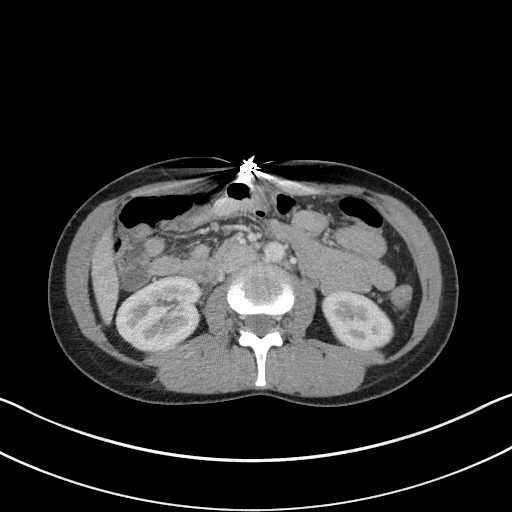
[im 62/98  soft-tissue]
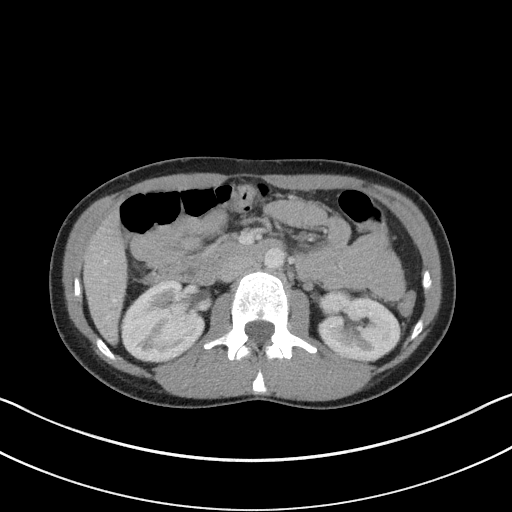
[im 62/98  bone]
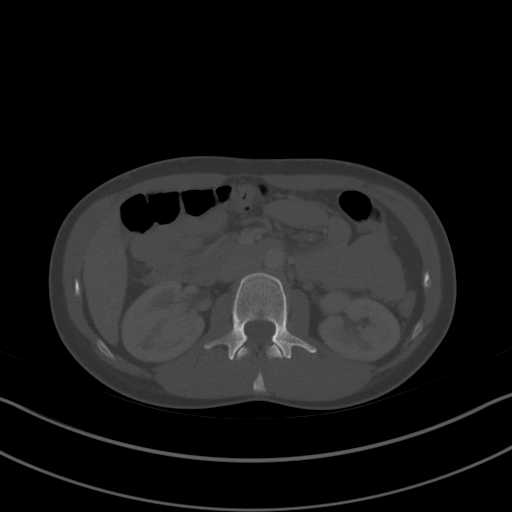
[im 71/98  soft-tissue]
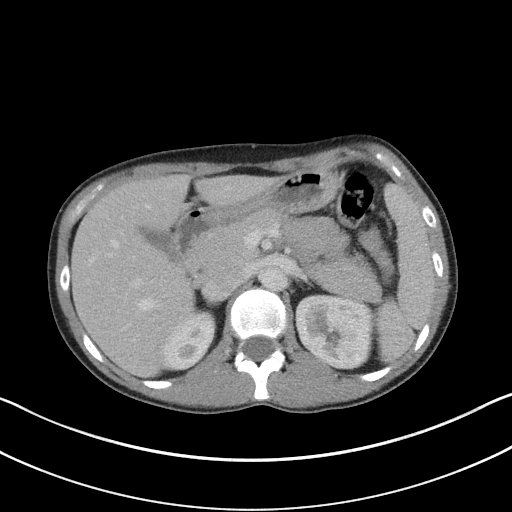
[im 75/98  soft-tissue]
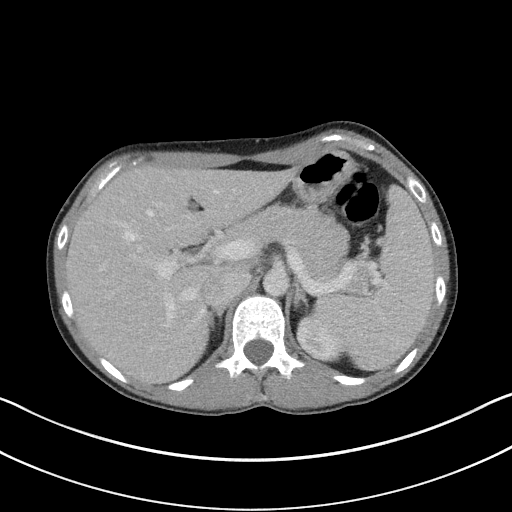
[im 84/98  soft-tissue]
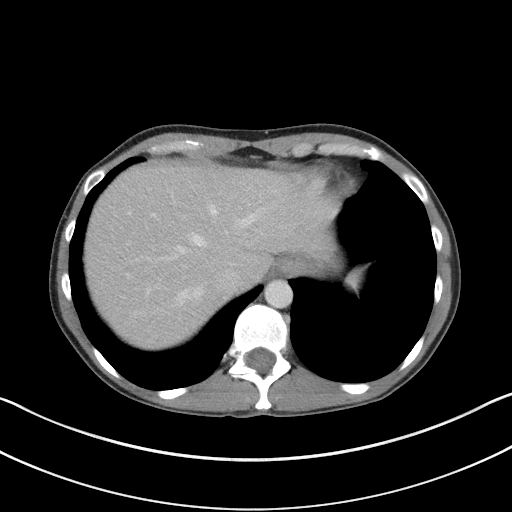
[im 93/98  soft-tissue]
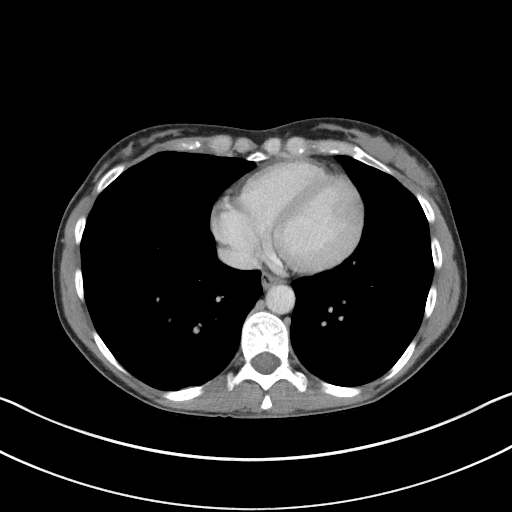

[Series 5: coronal st · coronal · 0.80mm/px · 3 of 72 slices shown]
[im 24/72  soft-tissue]
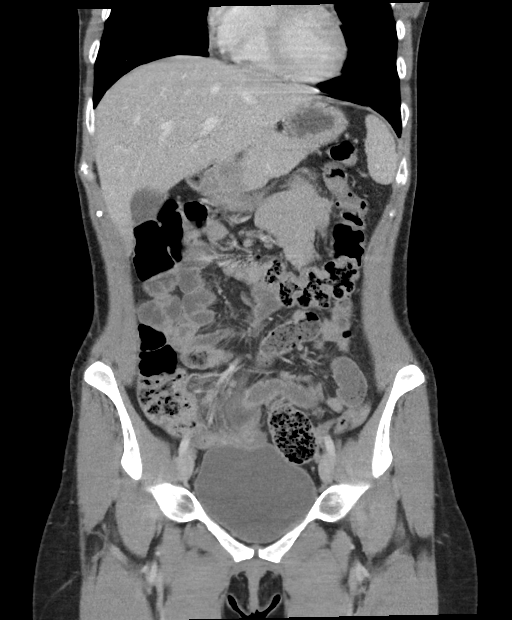
[im 32/72  soft-tissue]
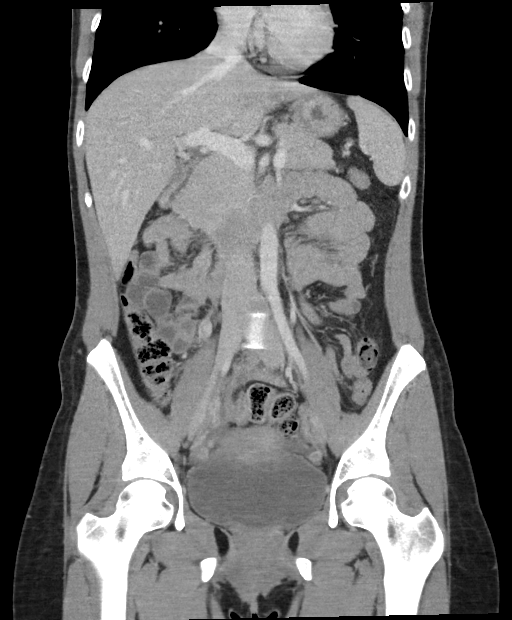
[im 40/72  soft-tissue]
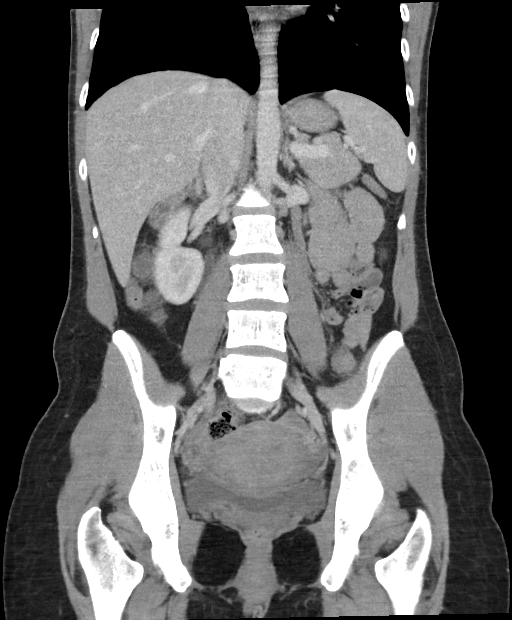

[16 of 46 positions shown; findings below may reference images not displayed]

RADIATION DOSE REDUCTION: This exam was performed according to the
departmental dose-optimization program which includes automated
exposure control, adjustment of the mA and/or kV according to
patient size and/or use of iterative reconstruction technique.

CONTRAST:  100mL OMNIPAQUE IOHEXOL 300 MG/ML  SOLN
FINDINGS: Lower chest: No acute abnormality.

Hepatobiliary: 17 mm low density with nodular enhancement is noted
posteriorly in right hepatic lobe most consistent with hemangioma.
No gallstones, gallbladder wall thickening, or biliary dilatation.

Pancreas: Unremarkable. No pancreatic ductal dilatation or
surrounding inflammatory changes.

Spleen: Normal in size without focal abnormality.

Adrenals/Urinary Tract: Adrenal glands are unremarkable. Kidneys are
normal, without renal calculi, focal lesion, or hydronephrosis.
Bladder is unremarkable.

Stomach/Bowel: The stomach appears normal. There is no evidence of
bowel obstruction. The appendix is enlarged with significant wall
thickening with surrounding inflammatory change consistent with
acute appendicitis. It is somewhat retrocecal in position. And
measures 15 cm in diameter. No definite abscess is noted.

Vascular/Lymphatic: No significant vascular findings are present. No
enlarged abdominal or pelvic lymph nodes.

Reproductive: Enlarged uterus is noted consistent with recently
postpartum status. No adnexal abnormality is noted.

Other: Small amount of free fluid is noted posteriorly in the
pelvis.

Musculoskeletal: No acute or significant osseous findings.
IMPRESSION: Findings consistent with acute appendicitis. No definite abscess
formation is noted.

17 mm low density with nodular enhancement is noted posteriorly in
right hepatic lobe most consistent with hemangioma, although other
pathology cannot be excluded. When the patient is clinically stable
and able to follow directions and hold their breath (preferably as
an outpatient) further evaluation with dedicated abdominal MRI
should be considered.

## 2023-10-09 NOTE — L&D Delivery Note (Signed)
 Patient complete and pushing. SVD of viable female infant over intact perineum. INfant was complete breech and delivered in 1 contraction to shoulders. Arms delivered and head flexed easily. Infant delivered to mom's abdomen. Delayed cord clamping done. Spontaneous cry heard. Weight and Apgars pending. Placenta delivered spontaneously and intact. EBL: 349cc Anesthesia: none

## 2024-03-13 ENCOUNTER — Observation Stay (HOSPITAL_COMMUNITY)
Admission: AD | Admit: 2024-03-13 | Discharge: 2024-03-13 | Disposition: A | Discharge status: Home / Self Care | Attending: Family Medicine | Admitting: Family Medicine

## 2024-03-13 ENCOUNTER — Inpatient Hospital Stay (HOSPITAL_COMMUNITY)
Admission: AD | Admit: 2024-03-13 | Discharge: 2024-03-13 | Disposition: A | Discharge status: Home / Self Care | Attending: Obstetrics and Gynecology | Admitting: Obstetrics and Gynecology

## 2024-03-13 ENCOUNTER — Ambulatory Visit (HOSPITAL_COMMUNITY): Payer: Self-pay

## 2024-03-13 ENCOUNTER — Encounter (HOSPITAL_COMMUNITY): Payer: Self-pay | Admitting: Obstetrics and Gynecology

## 2024-03-13 ENCOUNTER — Other Ambulatory Visit: Payer: Self-pay | Admitting: Certified Nurse Midwife

## 2024-03-13 DIAGNOSIS — Z3A39 39 weeks gestation of pregnancy: Secondary | ICD-10-CM

## 2024-03-13 DIAGNOSIS — O321XX0 Maternal care for breech presentation, not applicable or unspecified: Principal | ICD-10-CM | POA: Insufficient documentation

## 2024-03-13 DIAGNOSIS — O99891 Other specified diseases and conditions complicating pregnancy: Secondary | ICD-10-CM | POA: Insufficient documentation

## 2024-03-13 DIAGNOSIS — Z7689 Persons encountering health services in other specified circumstances: Secondary | ICD-10-CM | POA: Insufficient documentation

## 2024-03-13 DIAGNOSIS — O99892 Other specified diseases and conditions complicating childbirth: Secondary | ICD-10-CM | POA: Diagnosis present

## 2024-03-13 DIAGNOSIS — O26893 Other specified pregnancy related conditions, third trimester: Secondary | ICD-10-CM | POA: Diagnosis present

## 2024-03-13 DIAGNOSIS — M059 Rheumatoid arthritis with rheumatoid factor, unspecified: Secondary | ICD-10-CM | POA: Diagnosis not present

## 2024-03-13 LAB — DIFFERENTIAL
Abs Immature Granulocytes: 0.06 10*3/uL (ref 0.00–0.07)
Basophils Absolute: 0.1 10*3/uL (ref 0.0–0.1)
Basophils Relative: 1 %
Eosinophils Absolute: 0.2 10*3/uL (ref 0.0–0.5)
Eosinophils Relative: 1 %
Immature Granulocytes: 0 %
Lymphocytes Relative: 16 %
Lymphs Abs: 2.5 10*3/uL (ref 0.7–4.0)
Monocytes Absolute: 1.1 10*3/uL — ABNORMAL HIGH (ref 0.1–1.0)
Monocytes Relative: 7 %
Neutro Abs: 11.3 10*3/uL — ABNORMAL HIGH (ref 1.7–7.7)
Neutrophils Relative %: 75 %

## 2024-03-13 LAB — CBC
HCT: 35.4 % — ABNORMAL LOW (ref 36.0–46.0)
Hemoglobin: 11.3 g/dL — ABNORMAL LOW (ref 12.0–15.0)
MCH: 24.2 pg — ABNORMAL LOW (ref 26.0–34.0)
MCHC: 31.9 g/dL (ref 30.0–36.0)
MCV: 75.8 fL — ABNORMAL LOW (ref 80.0–100.0)
Platelets: 314 10*3/uL (ref 150–400)
RBC: 4.67 MIL/uL (ref 3.87–5.11)
RDW: 14.3 % (ref 11.5–15.5)
WBC: 15.2 10*3/uL — ABNORMAL HIGH (ref 4.0–10.5)
nRBC: 0 % (ref 0.0–0.2)

## 2024-03-13 LAB — RAPID HIV SCREEN (HIV 1/2 AB+AG)
HIV 1/2 Antibodies: NONREACTIVE
HIV-1 P24 Antigen - HIV24: NONREACTIVE

## 2024-03-13 LAB — HEPATITIS B SURFACE ANTIGEN: Hepatitis B Surface Ag: NONREACTIVE

## 2024-03-13 MED ORDER — OXYCODONE-ACETAMINOPHEN 5-325 MG PO TABS: 2.0000 | ORAL_TABLET | ORAL | Status: DC | PRN

## 2024-03-13 MED ORDER — ONDANSETRON HCL 4 MG/2ML IJ SOLN
4.0000 mg | Freq: Four times a day (QID) | INTRAMUSCULAR | Status: DC | PRN
Start: 2024-03-13 — End: 2024-03-13

## 2024-03-13 MED ORDER — LIDOCAINE HCL (PF) 1 % IJ SOLN
30.0000 mL | INTRAMUSCULAR | Status: DC | PRN
Start: 2024-03-13 — End: 2024-03-13

## 2024-03-13 MED ORDER — OXYTOCIN BOLUS FROM INFUSION: 333.0000 mL | Freq: Once | INTRAVENOUS | Status: DC

## 2024-03-13 MED ORDER — FLEET ENEMA RE ENEM: 1.0000 | ENEMA | RECTAL | Status: DC | PRN

## 2024-03-13 MED ORDER — TERBUTALINE SULFATE 1 MG/ML IJ SOLN: 0.2500 mg | Freq: Once | INTRAMUSCULAR | Status: DC | PRN

## 2024-03-13 MED ORDER — SOD CITRATE-CITRIC ACID 500-334 MG/5ML PO SOLN: 30.0000 mL | ORAL | Status: DC | PRN

## 2024-03-13 MED ORDER — OXYTOCIN-SODIUM CHLORIDE 30-0.9 UT/500ML-% IV SOLN
2.5000 [IU]/h | INTRAVENOUS | Status: DC
Start: 2024-03-13 — End: 2024-03-13

## 2024-03-13 MED ORDER — ACETAMINOPHEN 325 MG PO TABS: 650.0000 mg | ORAL_TABLET | ORAL | Status: DC | PRN

## 2024-03-13 MED ORDER — LACTATED RINGERS IV SOLN: INTRAVENOUS | Status: DC

## 2024-03-13 MED ORDER — OXYTOCIN 10 UNIT/ML IJ SOLN
INTRAMUSCULAR | Status: AC
  Filled 2024-03-13: qty 1

## 2024-03-13 MED ORDER — LACTATED RINGERS IV SOLN: 500.0000 mL | INTRAVENOUS | Status: DC | PRN

## 2024-03-13 MED ORDER — OXYCODONE-ACETAMINOPHEN 5-325 MG PO TABS: 1.0000 | ORAL_TABLET | ORAL | Status: DC | PRN

## 2024-03-13 NOTE — H&P (Signed)
 OBSTETRIC ADMISSION HISTORY AND PHYSICAL  Angela Atkinson is a 38 y.o. female G5P3000 with IUP at [redacted]w[redacted]d by patient report presenting for ROM following home birth CNM concerned about breech presentation. She reports +FMs, no VB, no blurry vision, headaches or peripheral edema, and RUQ pain.  She plans on breast feeding. She request NFP for birth control. She received her [facility] at [facility]   Sono:  none on file   Prenatal History/Complications:  N/a  Past Medical History: Past Medical History:  Diagnosis Date   Asthma    as a child   GERD (gastroesophageal reflux disease)    during pregnancy   Rheumatoid arthritis (HCC)     Past Surgical History: Past Surgical History:  Procedure Laterality Date   APPENDECTOMY     LAPAROSCOPIC APPENDECTOMY N/A 02/28/2022   Procedure: APPENDECTOMY LAPAROSCOPIC;  Surgeon: Flynn Hylan, MD;  Location: [facility] ORS;  Service: General;  Laterality: N/A;    Obstetrical History: OB History     Gravida  5   Para  3   Term  3   Preterm  0   AB  0   Living         SAB  0   IAB  0   Ectopic  0   Multiple      Live Births              Social History Social History   Socioeconomic History   Marital status: Married    Spouse name: Not on file   Number of children: 4   Years of education: Not on file   Highest education level: Not on file  Occupational History   Occupation: RN  Tobacco Use   Smoking status: Never   Smokeless tobacco: Never  Vaping Use   Vaping status: Never Used  Substance and Sexual Activity   Alcohol use: Never   Drug use: Never   Sexual activity: Not on file    Comment: pregnancy test negative 09-07-22  Other Topics Concern   Not on file  Social History Narrative   ** Merged History Encounter **       Social Drivers of Health   Financial Resource Strain: Low Risk  (11/14/2022)   Received from [facility], [facility]   Overall Financial Resource Strain (CARDIA)    Difficulty  of Paying Living Expenses: Not hard at all  Food Insecurity: Low Risk  (01/29/2024)   Received from [facility]   Hunger Vital Sign    Worried About Running Out of Food in the Last Year: Never true    Ran Out of Food in the Last Year: Never true  Transportation Needs: No Transportation Needs (01/29/2024)   Received from [facility]    In the past 12 months, has lack of reliable transportation kept you from medical appointments, meetings, work or from getting things needed for daily living? : No  Physical Activity: Insufficiently Active (11/14/2022)   Received from [facility], [facility]   Exercise Vital Sign    Days of Exercise per Week: 4 days    Minutes of Exercise per Session: 30 min  Stress: No Stress Concern Present (11/14/2022)   Received from [facility], [facility]   [employer] of Occupational Health - Occupational Stress Questionnaire    Feeling of Stress : Not at all  Social Connections: Socially Integrated (11/14/2022)   Received from [facility], [facility]   Social Network    How  would you rate your social network (family, work, friends)?: Good participation with social networks    Family History: Family History  Problem Relation Age of Onset   Liver disease Neg Hx    Esophageal cancer Neg Hx    Colon polyps Neg Hx    Rectal cancer Neg Hx    Stomach cancer Neg Hx     Allergies: No Known Allergies  Medications Prior to Admission  Medication Sig Dispense Refill Last Dose/Taking   calcium  carbonate (TUMS - DOSED IN MG ELEMENTAL CALCIUM ) 500 MG chewable tablet Chew 2 tablets (400 mg of elemental calcium  total) by mouth every 4 (four) hours as needed for indigestion. (Patient not taking: Reported on 02/28/2022)      etanercept (ENBREL) 50 MG/ML injection Inject 50 mg into the skin once a week.      omeprazole  (PRILOSEC) 40 MG capsule Take 1 capsule (40 mg total) by mouth daily. 90 capsule 1    Prenatal Vit-Fe Fumarate-FA  (MULTIVITAMIN-PRENATAL) 27-0.8 MG TABS tablet Take 1 tablet by mouth daily at 12 noon.        Review of Systems   All systems reviewed and negative except as stated in HPI  currently breastfeeding. General appearance: alert, cooperative, and appears stated age Lungs: clear to auscultation bilaterally Heart: regular rate and rhythm Abdomen: soft, non-tender; bowel sounds normal Pelvic: normal female genitalia  Extremities: Homans sign is negative, no sign of DVT Presentation: per home CNM foot felt prior to AROM Fetal monitoringBaseline: 145 bpm, Variability: Good {> 6 bpm), Accelerations: non-reactive, and Decelerations: Variable: moderate Uterine activity q57mins     Prenatal labs: ABO, Rh:   Antibody:   Rubella:   RPR:    HBsAg:    HIV:    GBS:     No results found for: "GBS" GTT not done Genetic screening  not done Anatomy US  not done    There is no immunization history on file for this patient.  Prenatal Transfer Tool  Maternal Diabetes: No Genetic Screening: Declined Maternal Ultrasounds/Referrals: Declined Fetal Ultrasounds or other Referrals:  None Maternal Substance Abuse:  No Significant Maternal Medications:  Meds include: Other:  etanercept but pt reports stopping around [redacted]wks gestation Significant Maternal Lab Results: Other: GBS unknown Number of Prenatal Visits:unknown, home birth CNM care Maternal Vaccinations:declined Other Comments:  None   No results found for this or any previous visit (from the past 24 hours).  Patient Active Problem List   Diagnosis Date Noted   Dysphagia 09/04/2022   Gastroesophageal reflux disease 09/04/2022   Acute appendicitis 02/28/2022   Normal labor and delivery 02/07/2022    Assessment/Plan:  Angela Atkinson is a 38 y.o. G5P3000 at Unknown here for SOL after ROM at home concerning for breech delivery  #Labor:expectant management  #Pain: Per patient request #FWB: Cat 2 #GBS status:  unknown #Feeding:  Breastmilk  #Reproductive Life planning: [facility] Planning   Ebony Goldstein, MD  03/13/2024, 7:23 AM

## 2024-03-13 NOTE — Lactation Note (Signed)
 This note was copied from a baby's chart. Lactation Consultation Note  Patient Name: Angela Atkinson [id number] Date: 03/13/2024 Age:37 hours   LC in the room to visit with family again but NICU RN Antony Baumgartner voiced Angela Atkinson has already step out of the room. Her doula has already set her up with a DEBP and pumping has been initiated. Angela Atkinson is a P5 and experienced breastfeeding. This LC brought an extra basin, storage bottles and cleaning supplies but unable to complete consult at this time as NICU RN and Dr. Palma Bob were in a "Bubble" with baby. LC services to F/U at a later time.   Angela Atkinson 03/13/2024, 6:49 PM

## 2024-03-13 NOTE — Lactation Note (Signed)
 This note was copied from a baby's chart. Lactation Consultation Note  Patient Name: Girl Itzae Mccurdy [id number] Date: 03/13/2024 Age:37 hours   Attempted to visit with family but MOB asked to be seen at a later time. She has already been discharged from L&D at 2-3 hours post-partum and currently room in with baby. LC services to check at a later time for initial assessment.    Ascencion Coye S Sukhdeep Wieting 03/13/2024, 4:45 PM

## 2024-03-13 NOTE — Plan of Care (Signed)
 Completed by RN.  Angela Atkinson, CNM, d/c pt from hospital after delivery. Baby in NICU.

## 2024-03-13 NOTE — MAU Provider Note (Signed)
  S Ms. Jowanda Heeg is a 38 y.o. [id number] patient who presents to MAU today for strongly recommedned prenatal labs for babe in the NICU.   O There were no vitals taken for this visit. Physical Exam CONSTITUTIONAL: Well-developed, well-nourished female in no acute distress.  HENT:  Normocephalic, atraumatic, External right and left ear normal. Oropharynx is clear and moist EYES: Conjunctivae and EOM are normal.  No scleral icterus.  NECK: Normal range of motion, supple, no masses.  Normal thyroid.  SKIN: Skin is warm and dry. No rash noted. Not diaphoretic. No erythema. No pallor. NEUROLGIC: Alert and oriented to person, place, and time.  CARDIOVASCULAR: Normal heart rate noted RESPIRATORY: Effort and breath sounds normal, no problems with respiration noted. ABDOMEN: Soft, normal bowel sounds, no distention noted.  No tenderness, rebound or guarding.  MUSCULOSKELETAL: Normal range of motion. No tenderness.  No cyanosis, clubbing, or edema.   A Medical screening exam complete Post partum state  P Discharge from MAU in stable condition  Warning signs for worsening condition that would warrant emergency follow-up discussed Patient may return to MAU as needed   Darrow End, MD 03/13/2024 7:18 PM

## 2024-03-13 NOTE — Progress Notes (Signed)
 Pt refusing newborn vitals and band application at this time.  Pt refusing vital signs, IV.  No baby meds.  Pt prefers lotus birth.

## 2024-03-13 NOTE — Progress Notes (Signed)
 Pt transported with baby to NICU. Lemuel Quaker, CNM allowed transport.

## 2024-03-13 NOTE — Discharge Summary (Signed)
 Postpartum Discharge Summary    Patient Name: Angela Atkinson DOB: 09-05-1986 MRN: [id number]  Date of admission: 03/13/2024 Delivery date:03/13/2024 Delivering provider: Granville Layer Date of discharge: 03/13/2024  Admitting diagnosis: Breech presentation [O32.1XX0] Normal labor Intrauterine pregnancy: [redacted]w[redacted]d     Secondary diagnosis:  Principal Problem:   Breech presentation Active Problems:   Normal labor and delivery   Seropositive rheumatoid arthritis (HCC)  Additional problems: none    Discharge diagnosis: Term Pregnancy Delivered                                              Post partum procedures:none Augmentation: N/A Complications: None  Hospital course: Onset of Labor With Vaginal Delivery      38 y.o. yo G5P5005 at [redacted]w[redacted]d was admitted in Active Labor on 03/13/2024. Labor course was complicated by breech presentation  Membrane Rupture Time/Date: 6:50 AM,03/13/2024  Delivery Method:Vaginal, Breech Operative Delivery:N/A Episiotomy: None Lacerations:  None Patient had a postpartum course complicated by none.  She is ambulating, tolerating a regular diet, passing flatus, and urinating well. Patient is discharged home in stable condition on 03/13/24.  Newborn Data: Birth date:03/13/2024 Birth time:7:22 AM Gender:Female Living status:Living Apgars:4 ,6  Weight:7 lb 3.3 oz (3.27 kg)  Magnesium Sulfate received: No BMZ received: No Rhophylac:N/A MMR:N/A T-DaP:declined Flu: N/A RSV Vaccine received: No Transfusion:No  Immunizations received: Immunization History  Administered Date(s) Administered   Influenza,inj,Quad PF,6+ Mos 07/20/2018, 08/07/2019, 08/05/2020   Influenza-Unspecified 07/09/2015, 07/12/2017   Tdap 07/18/2015   Physical exam  Vitals:   03/13/24 0713  BP: 117/84  Pulse: 80   General: alert, cooperative, and no distress Lochia: appropriate Uterine Fundus: firm DVT Evaluation: No evidence of DVT seen on physical exam. Labs: Lab Results  Component  Value Date   WBC 8.9 03/01/2022   HGB 9.7 (L) 03/01/2022   HCT 32.0 (L) 03/01/2022   MCV 79.6 (L) 03/01/2022   PLT 286 03/01/2022      Latest Ref Rng & Units 03/01/2022    6:14 AM  CMP  Glucose 70 - 99 mg/dL 409   BUN 6 - 20 mg/dL 15   Creatinine 8.11 - 1.00 mg/dL 9.14   Sodium 782 - 956 mmol/L 135   Potassium 3.5 - 5.1 mmol/L 3.8   Chloride 98 - 111 mmol/L 106   CO2 22 - 32 mmol/L 25   Calcium  8.9 - 10.3 mg/dL 7.7    Edinburgh Score:     No data to display         No data recorded  After visit meds:  Allergies as of 03/13/2024   No Known Allergies      Medication List     TAKE these medications    calcium  carbonate 500 MG chewable tablet Commonly known as: TUMS - dosed in mg elemental calcium  Chew 2 tablets (400 mg of elemental calcium  total) by mouth every 4 (four) hours as needed for indigestion.   Enbrel 50 MG/ML injection Generic drug: etanercept Inject 50 mg into the skin once a week.   multivitamin-prenatal 27-0.8 MG Tabs tablet Take 1 tablet by mouth daily at 12 noon.   omeprazole  40 MG capsule Commonly known as: PRILOSEC Take 1 capsule (40 mg total) by mouth daily.       Discharge home in stable condition Infant Feeding: Breast Infant Disposition:home with mother Discharge instruction: per After Visit Summary  and Postpartum booklet. Activity: Advance as tolerated. Pelvic rest for 6 weeks.  Diet: routine diet Future Appointments:No future appointments. Follow up Visit: Will follow up with her homebirth midwife.  03/13/2024 Derick Fleeting, CNM

## 2024-03-13 NOTE — Progress Notes (Signed)
 This RN tried to place a blanket on the baby and the patient refused. This RN tried to suction the baby with a bulb syringe and the patient refused. This RN tried to get vitals and the patient refused. This RN tried to stabilize the baby's heart rate and the patient refused. The provider, Adriana Hopping MD, was at bedside and reviewed the strip.

## 2024-03-13 NOTE — Progress Notes (Signed)
 1610 RN requested DAN to come see baby. Pt allowed pulse-ox to be placed on baby. O2 sat 40 %.  Pt asked for NICU to assess baby.  NICU neo arrived at 40.

## 2024-03-14 LAB — ABO/RH: ABO/RH(D): O POS

## 2024-03-14 LAB — RPR: RPR Ser Ql: NONREACTIVE

## 2024-03-15 LAB — RUBELLA SCREEN: Rubella: 2.19 {index} (ref >0.99)

## 2024-03-16 ENCOUNTER — Ambulatory Visit (HOSPITAL_COMMUNITY): Payer: Self-pay

## 2024-03-16 NOTE — Lactation Note (Signed)
 This note was copied from a baby's chart. Lactation Consultation Note  Patient Name: Angela Atkinson [id number] Date: 03/16/2024 Age:37 days   NICU RN Baldo Levan called out for lactation because Angela Atkinson wanted to do the [facility] loaner pump. Pump # [id number] issued with return date of 03/30/2024. WIC referral sent to [facility], she didn't get [facility] during this pregnancy, but she's eligible. All questions and concerns answered, family to contact [facility] services PRN.   Sebastain Fishbaugh S Sudie Bandel 03/16/2024, 6:00 PM

## 2024-03-16 NOTE — Lactation Note (Signed)
 This note was copied from a baby's chart.  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 Ms. Walkup is a P5 and experience breastfeeding; her plan is to primarily breastfeed if possible; she's not a fun of pumping and would rather take baby to breast. Resized her flanges to # 21, she's not using coconut oil but her own colostrum for lubrication. She's been pumping consistently every 3 hours her milk came in yesterday, she was engaging in STS care when entered the room, praised her for all her efforts. Reviewed pumping schedule, pumping log, lactogenesis II/III, breastfeeding with down syndrome (per her request), sanitizing pump pieces with Dr. Bevin Bucks bag,  IDF 1 and anticipatory guidelines.   OBJECTIVE Infant data: Mother's Current Feeding Choice: Breast Milk  O2 Device: HHFNC O2 Flow Rate (L/min): 5 L/min FiO2 (%): 30 %  Infant feeding assessment IDFTS - Readiness: 5   Maternal data: G5P5005 Vaginal, Breech Has patient been taught Hand Expression?: Yes Hand Expression Comments: plenty of colostrum Significant Breast History:: moderate breast changes during the pregnancy Current breast feeding challenges:: NICU admission Does the patient have breastfeeding experience prior to this delivery?: Yes How long did the patient breastfeed?: 2 years at least Pumping frequency: initiated pumping at 6 hours post-partum Pumped volume: 25 mL (25-60 ml; it varies) Flange Size: 24; 21 Hands-free pumping top sizes: -- (Has her own pumping bra) Risk factor for low/delayed milk supply:: infant separation, AMA  Pump: Personal (Medela Freestyle)  ASSESSMENT Infant: Feeding Status:  Scheduled 9-12-3-6 Feeding method: Tube/Gavage (Bolus)  Maternal: Milk volume: Normal  INTERVENTIONS/PLAN Interventions: Interventions: Breast feeding basics reviewed; DEBP; Education; [employer] brochure; CDC Guidelines for Breast Pump Cleaning; NICU Pumping Log Discharge Education: Engorgement and breast care Tools: Pump; Flanges; Hands-free pumping top Pump Education: Setup, frequency, and cleaning; Milk Storage  Plan: STS at care times Pump both breast every 3 hours on maintain mode for 20-30 minutes, ideally 8 pumping sessions/24 hours Ice her breast PRN (no S/S of engorgement at this time but they could get tender) Call out for assistance when "Angela Atkinson" is ready to go to breast, Ms. Lupien understands that first feedings/attempts will need to be at a pumped breast   No other support person at this time. All questions and concerns answered, family to contact [facility] services PRN.  Consult Status: NICU follow-up NICU Follow-up type: Verify absence of engorgement; Weekly NICU follow up   Joao Mccurdy S Clementine Cutting 03/16/2024, 11:17 AM

## 2024-03-25 ENCOUNTER — Telehealth (HOSPITAL_COMMUNITY): Payer: Self-pay | Admitting: *Deleted

## 2024-03-25 NOTE — Telephone Encounter (Signed)
 03/25/2024  Name: Angela Atkinson MRN: [id number] DOB: [birth year]  Reason for Call:  Transition of Care Hospital Discharge Call  Contact Status: Patient Contact Status: Complete  Language assistant needed:          Follow-Up Questions: Do You Have Any Concerns About Your Health As You Heal From Delivery?: No Do You Have Any Concerns About Your Infants Health?: Infant in NICU  Edinburgh Postnatal Depression Scale:  In the Past 7 Days: I have been able to laugh and see the funny side of things.: Definitely not so much now I have looked forward with enjoyment to things.: Rather less than I used to I have blamed myself unnecessarily when things went wrong.: No, never I have been anxious or worried for no good reason.: No, not at all I have felt scared or panicky for no good reason.: No, not at all Things have been getting on top of me.: Yes, most of the time I haven't been able to cope at all I have been so unhappy that I have had difficulty sleeping.: Not at all I have felt sad or miserable.: Yes, quite often I have been so unhappy that I have been crying.: No, never The thought of harming myself has occurred to me.: Never [name] Postnatal Depression Scale Total: 8 Patient reported that she had the opportunity to meet with the psychologist in the NICU today. Feels supported and has access to resources at this time. PHQ2-9 Depression Scale:     Discharge Follow-up: Edinburgh score requires follow up?: No Patient was advised of the following resources:: Breastfeeding Support Group, Support Group  Post-discharge interventions: Reviewed Newborn Safe Sleep Practices  Signature Julien Odor, RN, 03/25/24, [phone]

## 2024-03-31 ENCOUNTER — Other Ambulatory Visit (INDEPENDENT_AMBULATORY_CARE_PROVIDER_SITE_OTHER): Payer: Self-pay | Admitting: Pediatric Genetics

## 2024-03-31 DIAGNOSIS — Z8279 Family history of other congenital malformations, deformations and chromosomal abnormalities: Secondary | ICD-10-CM

## 2024-03-31 NOTE — Addendum Note (Signed)
 Addended byBETHA GEORGIANNA EE on: 03/31/2024 03:26 PM   Modules accepted: Orders

## 2024-04-08 ENCOUNTER — Ambulatory Visit: Attending: Obstetrics and Gynecology | Admitting: Obstetrics and Gynecology

## 2024-04-08 ENCOUNTER — Ambulatory Visit: Payer: Self-pay | Admitting: Obstetrics and Gynecology

## 2024-04-08 DIAGNOSIS — Z8279 Family history of other congenital malformations, deformations and chromosomal abnormalities: Secondary | ICD-10-CM | POA: Diagnosis not present

## 2024-04-08 NOTE — Progress Notes (Signed)
 Virtual Visit via Video Note  I connected with Angela Atkinson on 04/08/24 at 10:30 AM EDT by a video enabled telemedicine application and verified that I am speaking with the correct person using two identifiers.  Location: Patient: home () Provider: [facility] [place] [facility] Length of Consultation: 55 minutes  Angela Atkinson  was referred to [facility] for genetic counseling to discuss future pregnancy planning, as the couple had a recent pregnancy with Down syndrome.  The patient was present on this virtual visit with her husband, Angela Atkinson.  Family history and pregnancy history: We obtained a detailed family history and pregnancy history. Angela Atkinson and Angela Atkinson have had five pregnancies.  They have four healthy sons, ages 27, 69, 4 and 2 years. Their most recent baby, Angela Atkinson (dob [birth year]24), was diagnosed with Down syndrome after birth and passed away at a few weeks old due to complications in the neonatal period. Angela Atkinson reported that the pregnancy was uneventful other than feeling like Angela Atkinson moved around less than her other babies.  She elected to have minimal prenatal care or genetic screening during the pregnancy, so they were unaware of the diagnosis prior to Angela Atkinson's arrival. The couple prefers to have home births, but Angela Atkinson was in breech presentation so Angela Atkinson delivered at [facility]. There is no other history of Down syndrome in the family and no other babies who have passed away in infancy. Angela Atkinson is of [place] ancestry and Angela Atkinson is of European Caucasian background. In the family history, the follow concerns were reviewed:  Autoimmune conditions: Several family members were reported to have various conditions which are autoimmune in nature. Angela Atkinson has rheumatoid arthritis, while her maternal half sister, their mother, a maternal aunt and great aunt have all been diagnosed with rheumatoid arthritis and thyroid issues. Angela Atkinson has been diagnosed with  autoimmune hepatitis; his brother with an autoimmune condition resulting in nasal polyps and his mother with rheumatoid arthritis. The possible inheritance of these conditions is unclear because there are thought to be multiple genetic as well as environmental factors involved in their development. While links to some genetic factors, including certain HLA types, have been found, these are not thought to be the sole cause for these conditions.  When there is a strong family history of autoimmune conditions, we know that there is an increased risk for other family members to have a similar condition.  Colon cancer: Angela Atkinson reported that this paternal grandfather was diagnosed with colon cancer in his 64s.  His father is also reported to have had cancer, though they were not certain if it was colon, bladder or possibly prostate.  The family feels this diagnosis was the result of exposure to agent orange.  We encouraged Angela Atkinson to speak with his providers about screening recommendations for colon cancer for himself given this history.  The family would also meet guidelines for genetic testing and counseling for colorectal cancer predisposition and we can connect them with a cancer genetic counselor if desired.  Ideally, his father would be the best to have testing since he has been diagnosed with cancer in the past.  The remainder of the family history was reported to be unremarkable for birth defects, intellectual delays, recurrent pregnancy loss or known chromosome abnormalities.  Previous pregnancy with Down syndrome (Trisomy 21): We did not spend a great deal of time reviewing the clinical features of Trisomy 21, other than discussing the high variability among individuals with this condition.  Angela Atkinson had a very complex course that is not often typical of babies with Down syndrome. Angela Atkinson did, however, have many of the common features including facial differences as well as low muscle tone. One part of Angela Atkinson's  history included a liver mass of uncertain etiology. 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  I reached out to both the pediatric genetics and cancer genetics groups at [facility] and both felt that it was unlikely that the liver findings were the result of a separate inherited condition or cancer predisposition.    To review, Down syndrome is one of the most common extra chromosome conditions, as approximately 1 in 800 babies are born with this condition. There are different types of Down syndrome, with each type determined by the arrangement of the 21st chromosomes. Down syndrome most commonly occurs by chance due to an error in chromosomal division during the formation of egg and sperm cells in a process called nondisjunction (resulting in Trisomy 77). A small percentage of cases are caused by a chromosomal rearrangement (translocation) involving chromosome 21. A translocation occurs when there is an exchange in chromosome material, such as when a segment of one chromosome breaks off and reattaches to a different chromosome. If a parent carries a balanced translocation they do not have any extra or missing genetic material. However, they would have an increased risk to pass unbalanced chromosomes to their offspring (including translocation Down syndrome).    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 We reviewed that the chance of having a child with a chromosome condition does increase after having a previous pregnancy affected by a chromosomal aneuploidy. The chance for a chromosome difference in a pregnancy also increases with advancing maternal age. Since Angela Atkinson will be over the age of 30 at the time of delivery for future pregnancies, her chance of having another pregnancy affected by any chromosome condition is expected to be equal  to that of anyone her age. For example, based upon the age of 11 at possible delivery, the chance for Down syndrome at term is estimated to be 1 in 217 and the chance for any chromosome condition is 1 in 63. This chance gradually increases with maternal age.   There are several options to determine whether or not a future pregnancy is affected by a chromosomal aneuploidy, either prior to conception or during pregnancy. It is a personal choice for each family to determine if any of these tests are desired. First, ultrasound can provide information on the baby's growth and development. Approximately 50% of fetuses with Down syndrome and greater than 90% of babies with Trisomy 13 and 18 will show signs of the condition on ultrasound in the second trimester. Second, cell-free DNA screening (cfDNA) on a maternal blood sample is able to detect pregnancies affected by Down syndrome, Trisomy 13, Trisomy 18 and sex chromosome conditions with 91-99% sensitivity. Screening for additional conditions may also be included in this blood test. We reviewed that cfDNA is not diagnostic, but can provide information regarding the presence or absence of extra fetal DNA for a specific subset of chromosomes. Additionally, diagnostic testing via chorionic villus sampling (CVS) beginning at 11 weeks' gestation or amniocentesis beginning at 35 weeks' gestation are also available options. Finally, preimplantation genetic testing as part of IVF could also be considered.  Carrier screening: Per the ACOG Committee  Opinion 29, all women who are considering a pregnancy or are currently pregnant should be offered carrier screening for, at minimum, Cystic Fibrosis (CF), Spinal Muscular Atrophy (SMA), and Hemoglobinopathies The mode of inheritance, clinical manifestations of these conditions, as well as details about testing were reviewed. A negative result on carrier screening reduces the likelihood of being a carrier, however, does not  entirely rule out the possibility. If Chanah was found to be a carrier for a specific condition, carrier screening for their reproductive partner would be recommended. We offered the option of this screening, though there is no known family history of these conditions.  Plan of Care: Angela Atkinson and Angela Atkinson declined carrier screening for recessive conditions. The couple was encouraged to reach out early in any future pregnancy to review screening options.  They were clear that invasive prenatal diagnosis was not something they were interested in pursuing, but that cell free DNA screening and ultrasound may be useful for them for planning purposes.  Ms. Cavey was encouraged to call with questions or concerns.  We can be contacted at [phone].  Barnie PHEBE Dixons, MS, CGC  In total, 135 minutes were spent on the date of the encounter in service to the patient including preparation, record review, virtual video consultation, pedigree evaluation, consultation with specialists, documentation and care coordination.   Dixons Barnie
